# Patient Record
Sex: Male | Born: 1945
Health system: Southern US, Community
[De-identification: ages and names within clinical notes are randomized; demographics above are authoritative.]

## PROBLEM LIST (undated history)

## (undated) DIAGNOSIS — B360 Pityriasis versicolor: Secondary | ICD-10-CM

## (undated) DIAGNOSIS — G5600 Carpal tunnel syndrome, unspecified upper limb: Secondary | ICD-10-CM

## (undated) DIAGNOSIS — C4491 Basal cell carcinoma of skin, unspecified: Secondary | ICD-10-CM

## (undated) DIAGNOSIS — J069 Acute upper respiratory infection, unspecified: Secondary | ICD-10-CM

## (undated) DIAGNOSIS — M1990 Unspecified osteoarthritis, unspecified site: Secondary | ICD-10-CM

## (undated) DIAGNOSIS — N200 Calculus of kidney: Secondary | ICD-10-CM

## (undated) HISTORY — DX: Basal cell carcinoma of skin, unspecified: C44.91

## (undated) HISTORY — DX: Calculus of kidney: N20.0

## (undated) HISTORY — DX: Pityriasis versicolor: B36.0

## (undated) HISTORY — DX: Carpal tunnel syndrome, unspecified upper limb: G56.00

## (undated) HISTORY — PX: TONSILLECTOMY: SUR1361

## (undated) HISTORY — PX: VASECTOMY: SHX75

## (undated) HISTORY — PX: COLONOSCOPY: SHX174

## (undated) HISTORY — DX: Unspecified osteoarthritis, unspecified site: M19.90

---

## 1998-05-09 ENCOUNTER — Emergency Department (HOSPITAL_COMMUNITY): Admission: EM | Admit: 1998-05-09 | Discharge: 1998-05-09 | Payer: Self-pay | Admitting: Emergency Medicine

## 2011-08-17 ENCOUNTER — Other Ambulatory Visit (HOSPITAL_COMMUNITY): Payer: Self-pay | Admitting: Orthopedic Surgery

## 2011-08-17 ENCOUNTER — Other Ambulatory Visit: Payer: Self-pay | Admitting: Orthopedic Surgery

## 2011-08-17 DIAGNOSIS — M545 Low back pain, unspecified: Secondary | ICD-10-CM

## 2011-08-17 DIAGNOSIS — M5410 Radiculopathy, site unspecified: Secondary | ICD-10-CM

## 2011-08-18 ENCOUNTER — Ambulatory Visit
Admission: RE | Admit: 2011-08-18 | Discharge: 2011-08-18 | Disposition: A | Discharge status: Home / Self Care | Payer: BC Managed Care – PPO | Source: Ambulatory Visit | Attending: Orthopedic Surgery | Admitting: Orthopedic Surgery

## 2011-08-18 ENCOUNTER — Emergency Department (HOSPITAL_COMMUNITY)
Admission: EM | Admit: 2011-08-18 | Discharge: 2011-08-18 | Disposition: A | Discharge status: Home / Self Care | Payer: BC Managed Care – PPO | Attending: Emergency Medicine | Admitting: Emergency Medicine

## 2011-08-18 DIAGNOSIS — M545 Low back pain, unspecified: Secondary | ICD-10-CM | POA: Insufficient documentation

## 2011-08-18 DIAGNOSIS — M79609 Pain in unspecified limb: Secondary | ICD-10-CM | POA: Insufficient documentation

## 2011-08-18 DIAGNOSIS — R209 Unspecified disturbances of skin sensation: Secondary | ICD-10-CM | POA: Insufficient documentation

## 2011-08-18 DIAGNOSIS — M5430 Sciatica, unspecified side: Secondary | ICD-10-CM | POA: Insufficient documentation

## 2011-08-18 MED ORDER — GADOBENATE DIMEGLUMINE 529 MG/ML IV SOLN
14.0000 mL | Freq: Once | INTRAVENOUS | Status: AC | PRN
  Administered 2011-08-18: 14 mL via INTRAVENOUS

## 2011-09-28 ENCOUNTER — Ambulatory Visit (HOSPITAL_COMMUNITY)
Admission: RE | Admit: 2011-09-28 | Discharge: 2011-09-28 | Disposition: A | Discharge status: Home / Self Care | Payer: BC Managed Care – PPO | Source: Ambulatory Visit | Attending: Neurosurgery | Admitting: Neurosurgery

## 2011-09-28 ENCOUNTER — Encounter (HOSPITAL_COMMUNITY)
Admission: RE | Admit: 2011-09-28 | Discharge: 2011-09-28 | Disposition: A | Discharge status: Home / Self Care | Payer: BC Managed Care – PPO | Source: Ambulatory Visit | Attending: Neurosurgery | Admitting: Neurosurgery

## 2011-09-28 ENCOUNTER — Other Ambulatory Visit (HOSPITAL_COMMUNITY): Payer: Self-pay | Admitting: Neurosurgery

## 2011-09-28 ENCOUNTER — Encounter (HOSPITAL_COMMUNITY): Payer: Self-pay

## 2011-09-28 DIAGNOSIS — M79605 Pain in left leg: Secondary | ICD-10-CM

## 2011-09-28 DIAGNOSIS — Z01812 Encounter for preprocedural laboratory examination: Secondary | ICD-10-CM | POA: Insufficient documentation

## 2011-09-28 DIAGNOSIS — Z01818 Encounter for other preprocedural examination: Secondary | ICD-10-CM | POA: Insufficient documentation

## 2011-09-28 HISTORY — DX: Acute upper respiratory infection, unspecified: J06.9

## 2011-09-28 LAB — COMPREHENSIVE METABOLIC PANEL
ALT: 12 U/L (ref 0–53)
AST: 16 U/L (ref 0–37)
Albumin: 3.5 g/dL (ref 3.5–5.2)
Alkaline Phosphatase: 93 U/L (ref 39–117)
BUN: 12 mg/dL (ref 6–23)
CO2: 29 mEq/L (ref 19–32)
Calcium: 11 mg/dL — ABNORMAL HIGH (ref 8.4–10.5)
Chloride: 102 mEq/L (ref 96–112)
Creatinine, Ser: 0.74 mg/dL (ref 0.50–1.35)
GFR calc Af Amer: >90 mL/min (ref >90)
GFR calc non Af Amer: >90 mL/min (ref >90)
Glucose, Bld: 103 mg/dL — ABNORMAL HIGH (ref 70–99)
Potassium: 4.8 mEq/L (ref 3.5–5.1)
Sodium: 140 mEq/L (ref 135–145)
Total Bilirubin: 1 mg/dL (ref 0.3–1.2)
Total Protein: 6.5 g/dL (ref 6.0–8.3)

## 2011-09-28 LAB — SURGICAL PCR SCREEN
MRSA, PCR: NEGATIVE
Staphylococcus aureus: NEGATIVE

## 2011-09-28 LAB — CBC
HCT: 42 % (ref 39.0–52.0)
Hemoglobin: 13.2 g/dL (ref 13.0–17.0)
MCH: 29.1 pg (ref 26.0–34.0)
MCHC: 31.4 g/dL (ref 30.0–36.0)
MCV: 92.5 fL (ref 78.0–100.0)
Platelets: 299 10*3/uL (ref 150–400)
RBC: 4.54 MIL/uL (ref 4.22–5.81)
RDW: 12.7 % (ref 11.5–15.5)
WBC: 11.6 10*3/uL — ABNORMAL HIGH (ref 4.0–10.5)

## 2011-09-28 NOTE — Pre-Procedure Instructions (Signed)
20 Alexander Cummings  09/28/2011   Your procedure is scheduled on:   Monday  November 5TH  Report to [facility] at 0930 AM.  Call this number if you have problems the morning of surgery: [phone]   Remember:   Do not eat food:After Midnight.  Do not drink clear liquids: 4 Hours before arrival.  Take these medicines the morning of surgery with A SIP OF WATER: Tylenol as instructed by MD   Do not wear jewelry, make-up or nail polish.  Do not wear lotions, powders, or perfumes. You may wear deodorant.  Do not shave 48 hours prior to surgery.  Do not bring valuables to the hospital.  Contacts, dentures or bridgework may not be worn into surgery.  Leave suitcase in the car. After surgery it may be brought to your room.  For patients admitted to the hospital, checkout time is 11:00 AM the day of discharge.   Patients discharged the day of surgery will not be allowed to drive home.  Name and phone number of your driverISAAC DUBIE  -- [phone]  Special Instructions: CHG Shower Use Special Wash: 1/2 bottle night before surgery and 1/2 bottle morning of surgery.   Please read over the following fact sheets that you were given: Pain Booklet, Coughing and Deep Breathing, MRSA Information and Surgical Site Infection Prevention

## 2011-10-02 MED ORDER — CEFAZOLIN SODIUM 1-5 GM-% IV SOLN
1.0000 g | INTRAVENOUS | Status: DC
  Filled 2011-10-02: qty 50

## 2011-10-03 ENCOUNTER — Ambulatory Visit (HOSPITAL_COMMUNITY)
Admission: RE | Admit: 2011-10-03 | Discharge: 2011-10-04 | Disposition: A | Discharge status: Home / Self Care | Payer: BC Managed Care – PPO | Source: Ambulatory Visit | Attending: Neurosurgery | Admitting: Neurosurgery

## 2011-10-03 ENCOUNTER — Ambulatory Visit (HOSPITAL_COMMUNITY): Payer: BC Managed Care – PPO

## 2011-10-03 ENCOUNTER — Ambulatory Visit (HOSPITAL_COMMUNITY): Payer: BC Managed Care – PPO | Admitting: Anesthesiology

## 2011-10-03 ENCOUNTER — Encounter (HOSPITAL_COMMUNITY): Payer: Self-pay | Admitting: *Deleted

## 2011-10-03 ENCOUNTER — Encounter (HOSPITAL_COMMUNITY)
Admission: RE | Disposition: A | Discharge status: Home / Self Care | Payer: Self-pay | Source: Ambulatory Visit | Attending: Neurosurgery

## 2011-10-03 ENCOUNTER — Encounter (HOSPITAL_COMMUNITY): Payer: Self-pay | Admitting: Anesthesiology

## 2011-10-03 DIAGNOSIS — M5126 Other intervertebral disc displacement, lumbar region: Secondary | ICD-10-CM | POA: Insufficient documentation

## 2011-10-03 HISTORY — PX: LUMBAR LAMINECTOMY/DECOMPRESSION MICRODISCECTOMY: SHX5026

## 2011-10-03 SURGERY — LUMBAR LAMINECTOMY/DECOMPRESSION MICRODISCECTOMY
Anesthesia: General | Site: Spine Lumbar | Laterality: Left | Wound class: Clean

## 2011-10-03 MED ORDER — FENTANYL CITRATE 0.05 MG/ML IJ SOLN
INTRAMUSCULAR | Status: DC | PRN
  Administered 2011-10-03: 100 ug via INTRAVENOUS
  Administered 2011-10-03: 50 ug via INTRAVENOUS

## 2011-10-03 MED ORDER — EPHEDRINE SULFATE 50 MG/ML IJ SOLN
INTRAMUSCULAR | Status: DC | PRN
  Administered 2011-10-03 (×2): 10 mg via INTRAVENOUS

## 2011-10-03 MED ORDER — KETOROLAC TROMETHAMINE 30 MG/ML IJ SOLN
30.0000 mg | Freq: Four times a day (QID) | INTRAMUSCULAR | Status: DC
  Administered 2011-10-04 (×2): 30 mg via INTRAVENOUS
  Filled 2011-10-03 (×6): qty 1

## 2011-10-03 MED ORDER — SODIUM CHLORIDE 0.9 % IJ SOLN: 3.0000 mL | INTRAMUSCULAR | Status: DC | PRN

## 2011-10-03 MED ORDER — MORPHINE SULFATE 4 MG/ML IJ SOLN: 4.0000 mg | INTRAMUSCULAR | Status: DC | PRN

## 2011-10-03 MED ORDER — MENTHOL 3 MG MT LOZG
1.0000 | LOZENGE | OROMUCOSAL | Status: DC | PRN
  Administered 2011-10-03: 3 mg via ORAL
  Filled 2011-10-03: qty 9

## 2011-10-03 MED ORDER — ROCURONIUM BROMIDE 100 MG/10ML IV SOLN
INTRAVENOUS | Status: DC | PRN
  Administered 2011-10-03: 50 mg via INTRAVENOUS

## 2011-10-03 MED ORDER — ONDANSETRON HCL 4 MG/2ML IJ SOLN
INTRAMUSCULAR | Status: DC | PRN
  Administered 2011-10-03: 4 mg via INTRAVENOUS

## 2011-10-03 MED ORDER — ZOLPIDEM TARTRATE 5 MG PO TABS: 10.0000 mg | ORAL_TABLET | Freq: Every evening | ORAL | Status: DC | PRN

## 2011-10-03 MED ORDER — LIDOCAINE-EPINEPHRINE 1 %-1:100000 IJ SOLN
INTRAMUSCULAR | Status: DC | PRN
  Administered 2011-10-03: 6.5 mL

## 2011-10-03 MED ORDER — HYDROMORPHONE HCL PF 1 MG/ML IJ SOLN: 0.2500 mg | INTRAMUSCULAR | Status: DC | PRN

## 2011-10-03 MED ORDER — MEPERIDINE HCL 25 MG/ML IJ SOLN: 6.2500 mg | INTRAMUSCULAR | Status: DC | PRN

## 2011-10-03 MED ORDER — PROPOFOL 10 MG/ML IV EMUL
INTRAVENOUS | Status: DC | PRN
  Administered 2011-10-03: 200 mg via INTRAVENOUS

## 2011-10-03 MED ORDER — ALUM & MAG HYDROXIDE-SIMETH 400-400-40 MG/5ML PO SUSP
30.0000 mL | Freq: Four times a day (QID) | ORAL | Status: DC | PRN
  Filled 2011-10-03: qty 30

## 2011-10-03 MED ORDER — DOCUSATE SODIUM 100 MG PO CAPS
100.0000 mg | ORAL_CAPSULE | Freq: Two times a day (BID) | ORAL | Status: DC
  Administered 2011-10-03: 100 mg via ORAL
  Filled 2011-10-03: qty 1

## 2011-10-03 MED ORDER — GLYCOPYRROLATE 0.2 MG/ML IJ SOLN
INTRAMUSCULAR | Status: DC | PRN
  Administered 2011-10-03: 0.2 mg via INTRAVENOUS
  Administered 2011-10-03: .5 mg via INTRAVENOUS

## 2011-10-03 MED ORDER — ACETAMINOPHEN 650 MG RE SUPP: 650.0000 mg | RECTAL | Status: DC | PRN

## 2011-10-03 MED ORDER — SODIUM CHLORIDE 0.9 % IJ SOLN
3.0000 mL | Freq: Two times a day (BID) | INTRAMUSCULAR | Status: DC
  Administered 2011-10-03: 3 mL via INTRAVENOUS

## 2011-10-03 MED ORDER — FENTANYL CITRATE 0.05 MG/ML IJ SOLN
INTRAMUSCULAR | Status: DC | PRN
  Administered 2011-10-03 (×2): 100 ug via INTRAVENOUS

## 2011-10-03 MED ORDER — KETOROLAC TROMETHAMINE 30 MG/ML IJ SOLN
30.0000 mg | Freq: Once | INTRAMUSCULAR | Status: AC
  Administered 2011-10-03: 30 mg via INTRAVENOUS

## 2011-10-03 MED ORDER — METHYLPREDNISOLONE ACETATE PF 80 MG/ML IJ SUSP
INTRAMUSCULAR | Status: DC | PRN
  Administered 2011-10-03: 80 mg via INTRAMUSCULAR

## 2011-10-03 MED ORDER — HYDROXYZINE HCL 50 MG/ML IM SOLN: 50.0000 mg | INTRAMUSCULAR | Status: DC | PRN

## 2011-10-03 MED ORDER — SODIUM CHLORIDE 0.9 % IV SOLN: 250.0000 mL | INTRAVENOUS | Status: DC

## 2011-10-03 MED ORDER — HYDROXYZINE HCL 25 MG PO TABS: 50.0000 mg | ORAL_TABLET | ORAL | Status: DC | PRN

## 2011-10-03 MED ORDER — HYDROCODONE-ACETAMINOPHEN 5-325 MG PO TABS: 1.0000 | ORAL_TABLET | ORAL | Status: DC | PRN

## 2011-10-03 MED ORDER — NEOSTIGMINE METHYLSULFATE 1 MG/ML IJ SOLN
INTRAMUSCULAR | Status: DC | PRN
  Administered 2011-10-03: 3.5 mg via INTRAVENOUS

## 2011-10-03 MED ORDER — OXYCODONE-ACETAMINOPHEN 5-325 MG PO TABS
1.0000 | ORAL_TABLET | ORAL | Status: DC | PRN
  Filled 2011-10-03: qty 2

## 2011-10-03 MED ORDER — LACTATED RINGERS IV SOLN
INTRAVENOUS | Status: DC | PRN
  Administered 2011-10-03: 13:00:00 via INTRAVENOUS

## 2011-10-03 MED ORDER — SODIUM CHLORIDE 0.9 % IR SOLN
Status: DC | PRN
  Administered 2011-10-03: 14:00:00

## 2011-10-03 MED ORDER — DEXTROSE-NACL 5-0.45 % IV SOLN
INTRAVENOUS | Status: DC
  Administered 2011-10-03: 16:00:00 via INTRAVENOUS

## 2011-10-03 MED ORDER — MIDAZOLAM HCL 5 MG/5ML IJ SOLN
INTRAMUSCULAR | Status: DC | PRN
  Administered 2011-10-03: 2 mg via INTRAVENOUS

## 2011-10-03 MED ORDER — THROMBIN 5000 UNITS EX KIT
PACK | CUTANEOUS | Status: DC | PRN
  Administered 2011-10-03: 5000 [IU] via TOPICAL

## 2011-10-03 MED ORDER — BUPIVACAINE HCL (PF) 0.5 % IJ SOLN
INTRAMUSCULAR | Status: DC | PRN
  Administered 2011-10-03: 6.5 mL

## 2011-10-03 MED ORDER — ACETAMINOPHEN 325 MG PO TABS: 650.0000 mg | ORAL_TABLET | ORAL | Status: DC | PRN

## 2011-10-03 MED ORDER — PHENOL 1.4 % MT LIQD: 1.0000 | OROMUCOSAL | Status: DC | PRN

## 2011-10-03 MED ORDER — CEFAZOLIN SODIUM 1-5 GM-% IV SOLN
INTRAVENOUS | Status: DC | PRN
  Administered 2011-10-03: 1 g via INTRAVENOUS

## 2011-10-03 MED ORDER — ONDANSETRON HCL 4 MG/2ML IJ SOLN: 4.0000 mg | Freq: Once | INTRAMUSCULAR | Status: DC | PRN

## 2011-10-03 MED ORDER — CYCLOBENZAPRINE HCL 10 MG PO TABS
10.0000 mg | ORAL_TABLET | Freq: Three times a day (TID) | ORAL | Status: DC | PRN
  Filled 2011-10-03: qty 1

## 2011-10-03 SURGICAL SUPPLY — 63 items
ADH SKN CLS APL DERMABOND .7 (GAUZE/BANDAGES/DRESSINGS) ×1
APL SKNCLS STERI-STRIP NONHPOA (GAUZE/BANDAGES/DRESSINGS)
BAG DECANTER FOR FLEXI CONT (MISCELLANEOUS) ×2 IMPLANT
BENZOIN TINCTURE PRP APPL 2/3 (GAUZE/BANDAGES/DRESSINGS) IMPLANT
BLADE SURG ROTATE 9660 (MISCELLANEOUS) IMPLANT
BRUSH SCRUB EZ PLAIN DRY (MISCELLANEOUS) ×2 IMPLANT
BUR ACORN 6.0 ACORN (BURR) IMPLANT
BUR ACRON 5.0MM COATED (BURR) IMPLANT
BUR MATCHSTICK NEURO 3.0 LAGG (BURR) ×2 IMPLANT
CANISTER SUCTION 2500CC (MISCELLANEOUS) ×2 IMPLANT
CLOTH BEACON ORANGE TIMEOUT ST (SAFETY) ×2 IMPLANT
CONT SPEC 4OZ CLIKSEAL STRL BL (MISCELLANEOUS) IMPLANT
DERMABOND ADVANCED (GAUZE/BANDAGES/DRESSINGS) ×1
DERMABOND ADVANCED .7 DNX12 (GAUZE/BANDAGES/DRESSINGS) IMPLANT
DRAPE LAPAROTOMY 100X72X124 (DRAPES) ×2 IMPLANT
DRAPE MICROSCOPE LEICA (MISCELLANEOUS) ×2 IMPLANT
DRAPE POUCH INSTRU U-SHP 10X18 (DRAPES) ×2 IMPLANT
DRSG EMULSION OIL 3X3 NADH (GAUZE/BANDAGES/DRESSINGS) IMPLANT
ELECT REM PT RETURN 9FT ADLT (ELECTROSURGICAL) ×2
ELECTRODE REM PT RTRN 9FT ADLT (ELECTROSURGICAL) ×1 IMPLANT
GAUZE SPONGE 4X4 16PLY XRAY LF (GAUZE/BANDAGES/DRESSINGS) IMPLANT
GLOVE BIO SURGEON STRL SZ 6.5 (GLOVE) ×1 IMPLANT
GLOVE BIO SURGEON STRL SZ8.5 (GLOVE) ×1 IMPLANT
GLOVE BIOGEL PI IND STRL 8 (GLOVE) ×1 IMPLANT
GLOVE BIOGEL PI INDICATOR 8 (GLOVE) ×2
GLOVE ECLIPSE 7.5 STRL STRAW (GLOVE) ×2 IMPLANT
GLOVE EXAM NITRILE LRG STRL (GLOVE) IMPLANT
GLOVE EXAM NITRILE MD LF STRL (GLOVE) ×1 IMPLANT
GLOVE EXAM NITRILE XL STR (GLOVE) IMPLANT
GLOVE EXAM NITRILE XS STR PU (GLOVE) IMPLANT
GLOVE INDICATOR 6.5 STRL GRN (GLOVE) ×1 IMPLANT
GLOVE OPTIFIT SS 8.0 STRL (GLOVE) ×1 IMPLANT
GOWN BRE IMP SLV AUR LG STRL (GOWN DISPOSABLE) ×2 IMPLANT
GOWN BRE IMP SLV AUR XL STRL (GOWN DISPOSABLE) ×2 IMPLANT
GOWN STRL REIN 2XL LVL4 (GOWN DISPOSABLE) ×1 IMPLANT
KIT BASIN OR (CUSTOM PROCEDURE TRAY) ×2 IMPLANT
KIT ROOM TURNOVER OR (KITS) ×2 IMPLANT
NDL HYPO 18GX1.5 BLUNT FILL (NEEDLE) IMPLANT
NDL SPNL 18GX3.5 QUINCKE PK (NEEDLE) ×1 IMPLANT
NDL SPNL 22GX3.5 QUINCKE BK (NEEDLE) ×1 IMPLANT
NEEDLE HYPO 18GX1.5 BLUNT FILL (NEEDLE) ×2 IMPLANT
NEEDLE HYPO 22GX1.5 SAFETY (NEEDLE) ×2 IMPLANT
NEEDLE SPNL 18GX3.5 QUINCKE PK (NEEDLE) ×2 IMPLANT
NEEDLE SPNL 22GX3.5 QUINCKE BK (NEEDLE) ×2 IMPLANT
NS IRRIG 1000ML POUR BTL (IV SOLUTION) ×2 IMPLANT
PACK LAMINECTOMY NEURO (CUSTOM PROCEDURE TRAY) ×2 IMPLANT
PAD ARMBOARD 7.5X6 YLW CONV (MISCELLANEOUS) ×6 IMPLANT
PATTIES SURGICAL .5 X1 (DISPOSABLE) ×2 IMPLANT
RUBBERBAND STERILE (MISCELLANEOUS) ×4 IMPLANT
SPONGE GAUZE 4X4 12PLY (GAUZE/BANDAGES/DRESSINGS) IMPLANT
SPONGE LAP 4X18 X RAY DECT (DISPOSABLE) IMPLANT
SPONGE SURGIFOAM ABS GEL SZ50 (HEMOSTASIS) ×2 IMPLANT
STRIP CLOSURE SKIN 1/2X4 (GAUZE/BANDAGES/DRESSINGS) IMPLANT
SUT PROLENE 6 0 BV (SUTURE) IMPLANT
SUT VIC AB 1 CT1 18XBRD ANBCTR (SUTURE) ×1 IMPLANT
SUT VIC AB 1 CT1 8-18 (SUTURE) ×2
SUT VIC AB 2-0 CP2 18 (SUTURE) ×2 IMPLANT
SUT VIC AB 3-0 SH 8-18 (SUTURE) ×1 IMPLANT
SYR 20CC LL (SYRINGE) ×2 IMPLANT
SYR 5ML LL (SYRINGE) ×1 IMPLANT
TOWEL OR 17X24 6PK STRL BLUE (TOWEL DISPOSABLE) ×2 IMPLANT
TOWEL OR 17X26 10 PK STRL BLUE (TOWEL DISPOSABLE) ×2 IMPLANT
WATER STERILE IRR 1000ML POUR (IV SOLUTION) ×2 IMPLANT

## 2011-10-03 NOTE — H&P (Signed)
Subjective: Patient is a 65 y.o. male who is admitted for treatment of left L4-5 foraminal and extraforaminal disc herniation. Patient's symptoms began the first week of September. He was treated with a six-day prednisone Dosepak but continued to have significant pain and was evaluated further with an MRI scan MRI demonstrated the left L4-5 foraminal and extra foraminal disc herniation. And the patient is now admitted for surgical discectomy.    There are no active problems to display for this patient.  Past Medical History  Diagnosis Date  . Recurrent upper respiratory infection (URI)     gets colds infrequently...    Past Surgical History  Procedure Date  . Tonsillectomy     Prescriptions prior to admission  Medication Sig Dispense Refill  . acetaminophen (TYLENOL) 500 MG tablet Take 500 mg by mouth every 6 (six) hours as needed. For pain       . diclofenac (VOLTAREN) 75 MG EC tablet Take 75 mg by mouth 2 (two) times daily as needed. For pain       . HYDROcodone-acetaminophen (NORCO) 5-325 MG per tablet Take 1 tablet by mouth every 6 (six) hours as needed. For pain       . ibuprofen (ADVIL,MOTRIN) 200 MG tablet Take 200-400 mg by mouth every 6 (six) hours as needed. For pain       . simvastatin (ZOCOR) 20 MG tablet Take 20 mg by mouth daily.         No Known Allergies  History  Substance Use Topics  . Smoking status: Not on file  . Smokeless tobacco: Not on file  . Alcohol Use: 0.6 oz/week    1 Cans of beer per week    History reviewed. No pertinent family history.   Review of Systems A comprehensive review of systems was negative.  Objective: Vital signs in last 24 hours: Temp:  [98.3 F (36.8 C)] 98.3 F (36.8 C) (11/05 0941) Pulse Rate:  [68] 68  (11/05 0941) Resp:  [18] 18  (11/05 0941) BP: (126)/(75) 126/75 mmHg (11/05 0941) SpO2:  [99 %] 99 % (11/05 0941) Weight:  [68.947 kg (152 lb)] 152 lb (68.947 kg) (11/05 1034)  EXAM  Patient is a well-developed  well-nourished white male in no acute distress lungs are clear to auscultation he has symmetrical respiratory excursion heart has a regular rate and rhythm normal S1 and S2 there is a 2/6 systolic murmur extremity examination shows no clubbing cyanosis or edema. Musculoskeletal examination shows no tenderness to palpation over the lumbar spinous processes or paralumbar musculature he is limited in forward flexion and about 70-80. He is able to extend to 10. Straight leg raising is negative. Neurologic examination shows 5 over 5 strength in the lower extremities Including the iliopsoas quadriceps dorsiflexors EHL and plantar flexor bilaterally. Sensation is intact to pinprick in the distal lower from its. Reflexes are 2 in the quadriceps and gastrocnemius I they're symmetrical bilaterally and toes are downgoing bilaterally. He has a normal gait and stance. Data Review CBC:  Lab Results  Component Value Date   WBC 11.6* 09/28/2011   RBC 4.54 09/28/2011   BMP:  Lab Results  Component Value Date   GLUCOSE 103* 09/28/2011   CO2 29 09/28/2011   BUN 12 09/28/2011   CREATININE 0.74 09/28/2011   CALCIUM 11.0* 09/28/2011    Assessment/Plan: 65 year old man with lumbar disc herniation. Admitted for left L4-5 extraforaminal microdiscectomy. The nature of his condition and the nature of surgery and the risks of surgery  have been discussed with the patient including risks of bleeding infection nerve root dysfunction with pain weakness numbness or paresthesias the risk of recurrent disc herniation and anesthetic risks of myocardial infarction stroke pneumonia and death. Understanding this he wishes to proceed with surgery.   Hewitt Shorts, MD 10/03/2011 12:30 PM

## 2011-10-03 NOTE — Anesthesia Preprocedure Evaluation (Addendum)
Anesthesia Evaluation  Patient identified by MRN, date of birth, ID band Patient awake    Reviewed: Allergy & Precautions, H&P , NPO status , Patient's Chart, lab work & pertinent test results, reviewed documented beta blocker date and time   Airway Mallampati: I TM Distance: >3 FB Neck ROM: Full    Dental No notable dental hx.    Pulmonary    Pulmonary exam normal       Cardiovascular neg cardio ROS     Neuro/Psych    GI/Hepatic negative GI ROS, Neg liver ROS,   Endo/Other  Negative Endocrine ROS  Renal/GU negative Renal ROS     Musculoskeletal   Abdominal   Peds  Hematology   Anesthesia Other Findings   Reproductive/Obstetrics                          Anesthesia Physical Anesthesia Plan  ASA: II  Anesthesia Plan: General   Post-op Pain Management:    Induction: Intravenous  Airway Management Planned: Oral ETT  Additional Equipment:   Intra-op Plan:   Post-operative Plan: Extubation in OR  Informed Consent: I have reviewed the patients History and Physical, chart, labs and discussed the procedure including the risks, benefits and alternatives for the proposed anesthesia with the patient or authorized representative who has indicated his/her understanding and acceptance.   Dental advisory given  Plan Discussed with: CRNA and Surgeon  Anesthesia Plan Comments:        Anesthesia Quick Evaluation

## 2011-10-03 NOTE — Anesthesia Procedure Notes (Signed)
Procedures

## 2011-10-03 NOTE — Transfer of Care (Signed)
Immediate Anesthesia Transfer of Care Note  Patient: Alexander Cummings  Procedure(s) Performed:  LUMBAR LAMINECTOMY/DECOMPRESSION MICRODISCECTOMY - LEFT Lumbar four-five EXTRAFORAMINAL MICRODISKECTOMY  Patient Location: PACU  Anesthesia Type: General  Level of Consciousness: awake, alert  and oriented  Airway & Oxygen Therapy: Patient Spontanous Breathing and Patient connected to face mask oxygen  Post-op Assessment: Report given to PACU RN, Post -op Vital signs reviewed and stable and Patient moving all extremities X 4  Post vital signs: stable  Complications: No apparent anesthesia complications

## 2011-10-03 NOTE — Anesthesia Postprocedure Evaluation (Signed)
  Anesthesia Post-op Note  Patient: Alexander Cummings  Procedure(s) Performed:  LUMBAR LAMINECTOMY/DECOMPRESSION MICRODISCECTOMY - LEFT Lumbar four-five EXTRAFORAMINAL MICRODISKECTOMY  Patient Location: PACU  Anesthesia Type: General  Level of Consciousness: awake, alert  and oriented  Airway and Oxygen Therapy: Patient Spontanous Breathing  Post-op Pain: mild  Post-op Assessment: Post-op Vital signs reviewed, Patient's Cardiovascular Status Stable, Respiratory Function Stable, Patent Airway and Pain level controlled  Post-op Vital Signs: Reviewed and stable  Complications: No apparent anesthesia complications and possible nerve injury

## 2011-10-03 NOTE — Procedures (Signed)
10/03/2011  2:52 PM  PATIENT:  Alexander Cummings  65 y.o. male  PRE-OPERATIVE DIAGNOSIS:  LUMBAR HERNIATED DISC LUMBAR DEGENERATIVE DISC DISEASE LUMBAR FOUR-FIVE ,SPONDYLOSIS  POST-OPERATIVE DIAGNOSIS:  left lumbar four-fiveherniated disc,degenerative disc disease,spondylosis  PROCEDURE:  Procedure(s): Left L4-L5 extraforaminal MICRODISCECTOMY  SURGEON:  Surgeon(s): Jennelle Human  ASSISTANTS: Delma Officer  ANESTHESIA:   general  EBL:     BLOOD ADMINISTERED:25 CC PRBC  CELL SAVER GIVEN: None  COUNT:Correct per nursing staff  DRAINS: none   SPECIMEN:  No Specimen  DICTATION: Patient was brought to the operating room placed under general endotracheal anesthesia. Patient was turned to a prone position the lumbar region was prepped with Betadine soap and solution and draped in a sterile fashion. An x-ray was taken and the L4-5 level to find the midline was infiltrated locally with epinephrine. The incision made over the L4-5 level and carried down to subcutaneous tissue bipolar cautery and electrocautery used to maintain hemostasis. The paraspinal muscles were dissected from the spinous process and lamina in a subperiosteal fashion a self-retaining retractor was placed. An x-ray was taken and the L4-5 level identified. Dissection was carried out over the facet complex laterally into the intertransverse space the intertransverse muscle was dissected and the intertransverse fascia was removed as well a lateral facetectomy was performed using the high-speed drill. The operating microscope was draped and brought in the field to enhance visualization the remainder of the decompression was performed using microdissection microsurgical technique. We were able to take any extra foraminal space and identified the lateral aspect of the annulus and the disc herniation which extended rostral from the disc space was directly compressing the exiting L4 nerve root. The fragment was  separate from the surrounding tissues and removed decompressing the exiting L4 nerve root. We then further examined the extraforaminal space to ensure that there were no further fragments none were found it was felt that good decompression of the L4 nerve root had been achieved.  The wound was irrigated with bacitracin solution hemostasis was established with bipolar cautery and then we instilled 2 cc of fentanyl and 80 mg of Depo-Medrol into the extraforaminal space and then proceeded with closure. Deep fascia was closed with interrupted undyed 1 Vicryl sutures the subcutaneous and subcuticular closed with interrupted inverted 2-0 undyed Vicryl sutures. The skin edges were approximated with Dermabond.  Following surgery the patient is to be turned back in supine position reversed an anesthetic extubated and transferred to the recovery further care. PLAN OF CARE: Admit for overnight observation  PATIENT DISPOSITION:  PACU - hemodynamically stable.   Delay start of Pharmacological VTE agent (>24hrs) due to surgical blood loss or risk of bleeding:  yes

## 2011-10-03 NOTE — Progress Notes (Signed)
   Temp:  [97.6 F (36.4 C)-98.3 F (36.8 C)] 98.1 F (36.7 C) (11/05 1700) Pulse Rate:  [68-82] 82  (11/05 1700) Resp:  [16-35] 16  (11/05 1700) BP: (116-130)/(61-75) 130/70 mmHg (11/05 1700) SpO2:  [97 %-100 %] 98 % (11/05 1630) Weight:  [68.947 kg (152 lb)] 152 lb (68.947 kg) (11/05 1034)  He is seen in postoperative rounds in his unit bed on 3500. Patient notes significant improvement in his radicular symptoms with both pain relief and relief of numbness. He has already gotten up and out of bed and voided without difficulty his wound is clean and dry he ate his dinner he still has his IV running. We will have the nursing staff changes IV to a sterile lock and we encouraged to ambulate in the halls. Strength is good   Plan: Progressive ambulation activity.

## 2011-10-04 MED ORDER — HYDROCODONE-ACETAMINOPHEN 5-325 MG PO TABS: 1.0000 | ORAL_TABLET | ORAL | Status: AC | PRN

## 2011-10-04 MED FILL — Chlorhexidine Gluconate Liquid 4%: CUTANEOUS | Qty: 118 | Status: AC

## 2011-10-04 NOTE — Discharge Summary (Signed)
Physician Discharge Summary  Patient ID: Alexander Cummings MRN: [id number] DOB/AGE: Jun 19, 1946 65 y.o.  Admit date: 10/03/2011 Discharge date: 10/04/2011  Admission Diagnoses:Left L45 Lumbar HNP   Discharge Diagnoses: same Active Problems:  * No active hospital problems. *    Discharged Condition: good  Hospital Course: Patient was admitted for a microdiscectomy which was performed yesterday he is doing very well following surgery with excellent relief of his pain and numbness in the left lower extremity his wound is healing nicely and we have given him instructions regarding wound care and activities following discharge been given a prescription for Norco 5/325 taking 1 or 2 every 4 hours when necessary for pain he is to followup with me in the office in 3 weeks and let me know if he has any difficulties in the meantime.  Consults: none  Significant Diagnostic Studies: none  Treatments: surgery: Left L45 extraforaminal microdiscectomy  Discharge Exam: Blood pressure 95/53, pulse 69, temperature 98.8 F (37.1 C), temperature source Oral, resp. rate 16, height 5\' 8"  (1.727 m), weight 68.947 kg (152 lb), SpO2 97.00%.   Disposition: Home   Current Discharge Medication List    START taking these medications   Details  !! HYDROcodone-acetaminophen (NORCO) 5-325 MG per tablet Take 1-2 tablets by mouth every 4 (four) hours as needed for pain. Qty: 50 tablet, Refills: 0     !! - Potential duplicate medications found. Please discuss with provider.    CONTINUE these medications which have NOT CHANGED   Details  acetaminophen (TYLENOL) 500 MG tablet Take 500 mg by mouth every 6 (six) hours as needed. For pain     diclofenac (VOLTAREN) 75 MG EC tablet Take 75 mg by mouth 2 (two) times daily as needed. For pain     !! HYDROcodone-acetaminophen (NORCO) 5-325 MG per tablet Take 1 tablet by mouth every 6 (six) hours as needed. For pain     ibuprofen (ADVIL,MOTRIN) 200 MG tablet Take  200-400 mg by mouth every 6 (six) hours as needed. For pain     simvastatin (ZOCOR) 20 MG tablet Take 20 mg by mouth daily.       !! - Potential duplicate medications found. Please discuss with provider.       Signed: Hewitt Shorts, MD 10/04/2011, 7:30 AM

## 2011-10-11 ENCOUNTER — Encounter (HOSPITAL_COMMUNITY): Payer: Self-pay | Admitting: Neurosurgery

## 2014-01-26 HISTORY — PX: MOHS SURGERY: SUR867

## 2014-04-30 ENCOUNTER — Encounter: Payer: Self-pay | Admitting: Internal Medicine

## 2014-07-03 ENCOUNTER — Ambulatory Visit (AMBULATORY_SURGERY_CENTER): Payer: Self-pay

## 2014-07-03 VITALS — Ht 68.0 in | Wt 147.2 lb

## 2014-07-03 DIAGNOSIS — Z1211 Encounter for screening for malignant neoplasm of colon: Secondary | ICD-10-CM

## 2014-07-03 MED ORDER — SUPREP BOWEL PREP KIT 17.5-3.13-1.6 GM/177ML PO SOLN: 1.0000 | Freq: Once | ORAL | Status: DC

## 2014-07-03 NOTE — Progress Notes (Signed)
No allergies to eggs or soy No home oxygen No past problems anesthesia No diet/weight loss meds  Has email  Emmi instructions given for colonoscopy

## 2014-07-17 ENCOUNTER — Ambulatory Visit (AMBULATORY_SURGERY_CENTER): Payer: BC Managed Care – PPO | Admitting: Internal Medicine

## 2014-07-17 ENCOUNTER — Encounter: Payer: Self-pay | Admitting: Internal Medicine

## 2014-07-17 VITALS — BP 118/55 | HR 55 | Temp 96.9°F | Resp 18 | Ht 68.0 in | Wt 147.0 lb

## 2014-07-17 DIAGNOSIS — Z1211 Encounter for screening for malignant neoplasm of colon: Secondary | ICD-10-CM

## 2014-07-17 MED ORDER — SODIUM CHLORIDE 0.9 % IV SOLN: 500.0000 mL | INTRAVENOUS | Status: DC

## 2014-07-17 NOTE — Progress Notes (Signed)
Report to PACU, RN, vss, BBS= Clear.  

## 2014-07-17 NOTE — Op Note (Signed)
[place]  [address] [place], [id number]   COLONOSCOPY PROCEDURE REPORT  PATIENT: Alexander, Cummings  MR#: [id number] BIRTHDATE: 14-Aug-1946 , 27  yrs. old GENDER: Male ENDOSCOPIST: Gatha Mayer, MD, [facility] PROCEDURE DATE:  07/17/2014 PROCEDURE:   Colonoscopy, screening First Screening Colonoscopy - Avg.  risk and is 50 yrs.  old or older - No.  Prior Negative Screening - Now for repeat screening. 10 or more years since last screening  History of Adenoma - Now for follow-up colonoscopy & has been > or = to 3 yrs.  N/A  Polyps Removed Today? No.  Recommend repeat exam, <10 yrs? No. ASA CLASS:   Class I INDICATIONS:average risk screening and Last colonoscopy performed 10 years ago. MEDICATIONS: propofol (Diprivan) 200mg  IV, MAC sedation, administered by CRNA, and These medications were titrated to patient response per physician's verbal order  DESCRIPTION OF PROCEDURE:   After the risks benefits and alternatives of the procedure were thoroughly explained, informed consent was obtained.  A digital rectal exam revealed no abnormalities of the rectum, A digital rectal exam revealed no prostatic nodules, and A digital rectal exam revealed the prostate was not enlarged.   The LB [id number]  endoscope was introduced through the anus and advanced to the cecum, which was identified by both the appendix and ileocecal valve. No adverse events experienced.   The quality of the prep was Suprep good  The instrument was then slowly withdrawn as the colon was fully examined.  COLON FINDINGS: A normal appearing cecum, ileocecal valve, and appendiceal orifice were identified.  The ascending, hepatic flexure, transverse, splenic flexure, descending, sigmoid colon and rectum appeared unremarkable.  No polyps or cancers were seen.   A right colon retroflexion was performed.   There was a there was a narrow rectal vault so retroflexion was not performed. Retroflexion was  not performed due to a narrow rectal vault. The time to cecum=1 minutes 56 seconds.  Withdrawal time=8 minutes 06 seconds.  The scope was withdrawn and the procedure completed. COMPLICATIONS: There were no complications.  ENDOSCOPIC IMPRESSION: 1.   Normal colon 2.   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

## 2014-07-17 NOTE — Patient Instructions (Addendum)
The colon was nornal.  Next routine colonoscopy in 10 years - 2025  I appreciate the opportunity to care for you. Gatha Mayer, MD, FACG  YOU HAD AN ENDOSCOPIC PROCEDURE TODAY AT [facility]: Refer to the procedure report that was given to you for any specific questions about what was found during the examination.  If the procedure report does not answer your questions, please call your gastroenterologist to clarify.  If you requested that your care partner not be given the details of your procedure findings, then the procedure report has been included in a sealed envelope for you to review at your convenience later.  YOU SHOULD EXPECT: Some feelings of bloating in the abdomen. Passage of more gas than usual.  Walking can help get rid of the air that was put into your GI tract during the procedure and reduce the bloating. If you had a lower endoscopy (such as a colonoscopy or flexible sigmoidoscopy) you may notice spotting of blood in your stool or on the toilet paper. If you underwent a bowel prep for your procedure, then you may not have a normal bowel movement for a few days.  DIET: Your first meal following the procedure should be a light meal and then it is ok to progress to your normal diet.  A half-sandwich or bowl of soup is an example of a good first meal.  Heavy or fried foods are harder to digest and may make you feel nauseous or bloated.  Likewise meals heavy in dairy and vegetables can cause extra gas to form and this can also increase the bloating.  Drink plenty of fluids but you should avoid alcoholic beverages for 24 hours.  ACTIVITY: Your care partner should take you home directly after the procedure.  You should plan to take it easy, moving slowly for the rest of the day.  You can resume normal activity the day after the procedure however you should NOT DRIVE or use heavy machinery for 24 hours (because of the sedation medicines used during the test).    SYMPTOMS  TO REPORT IMMEDIATELY: A gastroenterologist can be reached at any hour.  During normal business hours, 8:30 AM to 5:00 PM Monday through Friday, call [phone].  After hours and on weekends, please call the GI answering service at [phone] who will take a message and have the physician on call contact you.   Following lower endoscopy (colonoscopy or flexible sigmoidoscopy):  Excessive amounts of blood in the stool  Significant tenderness or worsening of abdominal pains  Swelling of the abdomen that is new, acute  Fever of 100F or higher   FOLLOW UP: If any biopsies were taken you will be contacted by phone or by letter within the next 1-3 weeks.  Call your gastroenterologist if you have not heard about the biopsies in 3 weeks.  Our staff will call the home number listed on your records the next business day following your procedure to check on you and address any questions or concerns that you may have at that time regarding the information given to you following your procedure. This is a courtesy call and so if there is no answer at the home number and we have not heard from you through the emergency physician on call, we will assume that you have returned to your regular daily activities without incident.  SIGNATURES/CONFIDENTIALITY: You and/or your care partner have signed paperwork which will be entered into your electronic medical record.  These  signatures attest to the fact that that the information above on your After Visit Summary has been reviewed and is understood.  Full responsibility of the confidentiality of this discharge information lies with you and/or your care-partner. 

## 2014-07-18 ENCOUNTER — Telehealth: Payer: Self-pay | Admitting: *Deleted

## 2014-07-18 NOTE — Telephone Encounter (Signed)
  Follow up Call-  Call back number 07/17/2014  Post procedure Call Back phone  # [phone] may speak with wife  Permission to leave phone message Yes     Patient questions:  Do you have a fever, pain , or abdominal swelling? No. Pain Score  0 *  Have you tolerated food without any problems? Yes.    Have you been able to return to your normal activities? Yes.    Do you have any questions about your discharge instructions: Diet   No. Medications  No. Follow up visit  No.  Do you have questions or concerns about your Care? No.  Actions: * If pain score is 4 or above: No action needed, pain <4.

## 2015-11-24 ENCOUNTER — Ambulatory Visit
Admission: RE | Admit: 2015-11-24 | Discharge: 2015-11-24 | Disposition: A | Discharge status: Home / Self Care | Payer: BLUE CROSS/BLUE SHIELD | Source: Ambulatory Visit | Attending: Family Medicine | Admitting: Family Medicine

## 2015-11-24 ENCOUNTER — Other Ambulatory Visit: Payer: Self-pay | Admitting: Family Medicine

## 2015-11-24 DIAGNOSIS — S9032XA Contusion of left foot, initial encounter: Secondary | ICD-10-CM

## 2016-07-25 DIAGNOSIS — D2271 Melanocytic nevi of right lower limb, including hip: Secondary | ICD-10-CM | POA: Diagnosis not present

## 2016-07-25 DIAGNOSIS — C44619 Basal cell carcinoma of skin of left upper limb, including shoulder: Secondary | ICD-10-CM | POA: Diagnosis not present

## 2016-07-25 DIAGNOSIS — Z85828 Personal history of other malignant neoplasm of skin: Secondary | ICD-10-CM | POA: Diagnosis not present

## 2016-07-25 DIAGNOSIS — D1801 Hemangioma of skin and subcutaneous tissue: Secondary | ICD-10-CM | POA: Diagnosis not present

## 2016-07-25 DIAGNOSIS — D225 Melanocytic nevi of trunk: Secondary | ICD-10-CM | POA: Diagnosis not present

## 2016-07-25 DIAGNOSIS — L738 Other specified follicular disorders: Secondary | ICD-10-CM | POA: Diagnosis not present

## 2016-07-25 DIAGNOSIS — L859 Epidermal thickening, unspecified: Secondary | ICD-10-CM | POA: Diagnosis not present

## 2016-07-25 DIAGNOSIS — L821 Other seborrheic keratosis: Secondary | ICD-10-CM | POA: Diagnosis not present

## 2016-07-25 DIAGNOSIS — D485 Neoplasm of uncertain behavior of skin: Secondary | ICD-10-CM | POA: Diagnosis not present

## 2016-07-28 DIAGNOSIS — Z411 Encounter for cosmetic surgery: Secondary | ICD-10-CM | POA: Diagnosis not present

## 2016-07-28 DIAGNOSIS — C44619 Basal cell carcinoma of skin of left upper limb, including shoulder: Secondary | ICD-10-CM | POA: Diagnosis not present

## 2016-09-13 DIAGNOSIS — Z0000 Encounter for general adult medical examination without abnormal findings: Secondary | ICD-10-CM | POA: Diagnosis not present

## 2016-09-13 DIAGNOSIS — E7800 Pure hypercholesterolemia, unspecified: Secondary | ICD-10-CM | POA: Diagnosis not present

## 2016-09-13 DIAGNOSIS — Z1159 Encounter for screening for other viral diseases: Secondary | ICD-10-CM | POA: Diagnosis not present

## 2016-09-13 DIAGNOSIS — R430 Anosmia: Secondary | ICD-10-CM | POA: Diagnosis not present

## 2016-09-13 DIAGNOSIS — M19042 Primary osteoarthritis, left hand: Secondary | ICD-10-CM | POA: Diagnosis not present

## 2016-09-13 DIAGNOSIS — Z125 Encounter for screening for malignant neoplasm of prostate: Secondary | ICD-10-CM | POA: Diagnosis not present

## 2016-09-13 DIAGNOSIS — Z23 Encounter for immunization: Secondary | ICD-10-CM | POA: Diagnosis not present

## 2016-09-13 DIAGNOSIS — L989 Disorder of the skin and subcutaneous tissue, unspecified: Secondary | ICD-10-CM | POA: Diagnosis not present

## 2016-09-13 DIAGNOSIS — M19041 Primary osteoarthritis, right hand: Secondary | ICD-10-CM | POA: Diagnosis not present

## 2016-09-16 ENCOUNTER — Other Ambulatory Visit: Payer: Self-pay | Admitting: Family Medicine

## 2016-09-16 DIAGNOSIS — R430 Anosmia: Secondary | ICD-10-CM

## 2016-09-19 DIAGNOSIS — H1013 Acute atopic conjunctivitis, bilateral: Secondary | ICD-10-CM | POA: Diagnosis not present

## 2016-10-03 ENCOUNTER — Ambulatory Visit
Admission: RE | Admit: 2016-10-03 | Discharge: 2016-10-03 | Disposition: A | Discharge status: Home / Self Care | Payer: PPO | Source: Ambulatory Visit | Attending: Family Medicine | Admitting: Family Medicine

## 2016-10-03 DIAGNOSIS — R430 Anosmia: Secondary | ICD-10-CM

## 2017-07-26 DIAGNOSIS — Z85828 Personal history of other malignant neoplasm of skin: Secondary | ICD-10-CM | POA: Diagnosis not present

## 2017-07-26 DIAGNOSIS — D2271 Melanocytic nevi of right lower limb, including hip: Secondary | ICD-10-CM | POA: Diagnosis not present

## 2017-07-26 DIAGNOSIS — B079 Viral wart, unspecified: Secondary | ICD-10-CM | POA: Diagnosis not present

## 2017-07-26 DIAGNOSIS — D225 Melanocytic nevi of trunk: Secondary | ICD-10-CM | POA: Diagnosis not present

## 2017-07-26 DIAGNOSIS — L821 Other seborrheic keratosis: Secondary | ICD-10-CM | POA: Diagnosis not present

## 2017-07-26 DIAGNOSIS — L570 Actinic keratosis: Secondary | ICD-10-CM | POA: Diagnosis not present

## 2017-09-15 DIAGNOSIS — E7800 Pure hypercholesterolemia, unspecified: Secondary | ICD-10-CM | POA: Diagnosis not present

## 2017-09-15 DIAGNOSIS — M19042 Primary osteoarthritis, left hand: Secondary | ICD-10-CM | POA: Diagnosis not present

## 2017-09-15 DIAGNOSIS — Z23 Encounter for immunization: Secondary | ICD-10-CM | POA: Diagnosis not present

## 2017-09-15 DIAGNOSIS — M19041 Primary osteoarthritis, right hand: Secondary | ICD-10-CM | POA: Diagnosis not present

## 2017-09-15 DIAGNOSIS — Z125 Encounter for screening for malignant neoplasm of prostate: Secondary | ICD-10-CM | POA: Diagnosis not present

## 2017-09-15 DIAGNOSIS — Z0000 Encounter for general adult medical examination without abnormal findings: Secondary | ICD-10-CM | POA: Diagnosis not present

## 2017-09-15 DIAGNOSIS — R430 Anosmia: Secondary | ICD-10-CM | POA: Diagnosis not present

## 2017-12-18 DIAGNOSIS — H04123 Dry eye syndrome of bilateral lacrimal glands: Secondary | ICD-10-CM | POA: Diagnosis not present

## 2018-08-31 DIAGNOSIS — D225 Melanocytic nevi of trunk: Secondary | ICD-10-CM | POA: Diagnosis not present

## 2018-08-31 DIAGNOSIS — D0362 Melanoma in situ of left upper limb, including shoulder: Secondary | ICD-10-CM | POA: Diagnosis not present

## 2018-08-31 DIAGNOSIS — Z23 Encounter for immunization: Secondary | ICD-10-CM | POA: Diagnosis not present

## 2018-08-31 DIAGNOSIS — D485 Neoplasm of uncertain behavior of skin: Secondary | ICD-10-CM | POA: Diagnosis not present

## 2018-08-31 DIAGNOSIS — L570 Actinic keratosis: Secondary | ICD-10-CM | POA: Diagnosis not present

## 2018-08-31 DIAGNOSIS — L821 Other seborrheic keratosis: Secondary | ICD-10-CM | POA: Diagnosis not present

## 2018-08-31 DIAGNOSIS — D2271 Melanocytic nevi of right lower limb, including hip: Secondary | ICD-10-CM | POA: Diagnosis not present

## 2018-08-31 DIAGNOSIS — Z85828 Personal history of other malignant neoplasm of skin: Secondary | ICD-10-CM | POA: Diagnosis not present

## 2018-09-20 ENCOUNTER — Other Ambulatory Visit: Payer: Self-pay

## 2018-09-20 ENCOUNTER — Encounter (HOSPITAL_COMMUNITY): Payer: Self-pay | Admitting: Emergency Medicine

## 2018-09-20 ENCOUNTER — Emergency Department (HOSPITAL_COMMUNITY)
Admission: EM | Admit: 2018-09-20 | Discharge: 2018-09-20 | Disposition: A | Discharge status: Home / Self Care | Payer: PPO | Attending: Emergency Medicine | Admitting: Emergency Medicine

## 2018-09-20 ENCOUNTER — Emergency Department (HOSPITAL_COMMUNITY): Payer: PPO

## 2018-09-20 DIAGNOSIS — R001 Bradycardia, unspecified: Secondary | ICD-10-CM | POA: Diagnosis not present

## 2018-09-20 DIAGNOSIS — Z79899 Other long term (current) drug therapy: Secondary | ICD-10-CM | POA: Diagnosis not present

## 2018-09-20 DIAGNOSIS — H903 Sensorineural hearing loss, bilateral: Secondary | ICD-10-CM | POA: Diagnosis not present

## 2018-09-20 DIAGNOSIS — Z125 Encounter for screening for malignant neoplasm of prostate: Secondary | ICD-10-CM | POA: Diagnosis not present

## 2018-09-20 DIAGNOSIS — I499 Cardiac arrhythmia, unspecified: Secondary | ICD-10-CM | POA: Diagnosis not present

## 2018-09-20 DIAGNOSIS — R634 Abnormal weight loss: Secondary | ICD-10-CM | POA: Diagnosis not present

## 2018-09-20 DIAGNOSIS — R6881 Early satiety: Secondary | ICD-10-CM | POA: Diagnosis not present

## 2018-09-20 DIAGNOSIS — R5383 Other fatigue: Secondary | ICD-10-CM | POA: Diagnosis not present

## 2018-09-20 DIAGNOSIS — Z23 Encounter for immunization: Secondary | ICD-10-CM | POA: Diagnosis not present

## 2018-09-20 DIAGNOSIS — E7800 Pure hypercholesterolemia, unspecified: Secondary | ICD-10-CM | POA: Diagnosis not present

## 2018-09-20 DIAGNOSIS — Z0000 Encounter for general adult medical examination without abnormal findings: Secondary | ICD-10-CM | POA: Diagnosis not present

## 2018-09-20 DIAGNOSIS — I442 Atrioventricular block, complete: Secondary | ICD-10-CM | POA: Diagnosis not present

## 2018-09-20 DIAGNOSIS — M79641 Pain in right hand: Secondary | ICD-10-CM | POA: Diagnosis not present

## 2018-09-20 LAB — CBC
HCT: 45.3 % (ref 39.0–52.0)
Hemoglobin: 14.5 g/dL (ref 13.0–17.0)
MCH: 30.3 pg (ref 26.0–34.0)
MCHC: 32 g/dL (ref 30.0–36.0)
MCV: 94.6 fL (ref 80.0–100.0)
Platelets: 209 10*3/uL (ref 150–400)
RBC: 4.79 MIL/uL (ref 4.22–5.81)
RDW: 12.5 % (ref 11.5–15.5)
WBC: 5.5 10*3/uL (ref 4.0–10.5)
nRBC: 0 % (ref 0.0–0.2)

## 2018-09-20 LAB — BASIC METABOLIC PANEL
Anion gap: 5 (ref 5–15)
BUN: 16 mg/dL (ref 8–23)
CO2: 26 mmol/L (ref 22–32)
Calcium: 9.9 mg/dL (ref 8.9–10.3)
Chloride: 106 mmol/L (ref 98–111)
Creatinine, Ser: 0.78 mg/dL (ref 0.61–1.24)
GFR calc Af Amer: >60 mL/min (ref >60)
GFR calc non Af Amer: >60 mL/min (ref >60)
Glucose, Bld: 101 mg/dL — ABNORMAL HIGH (ref 70–99)
Potassium: 4.2 mmol/L (ref 3.5–5.1)
Sodium: 137 mmol/L (ref 135–145)

## 2018-09-20 LAB — I-STAT TROPONIN, ED: Troponin i, poc: 0.01 ng/mL (ref 0.00–0.08)

## 2018-09-20 NOTE — Discharge Instructions (Signed)
Your EKG and work-up today is reassuring.  Your heart rate is a bit slower than normal but we do not see any abnormal heart rhythms.  You can follow-up with cardiology if your heart rate continues to be lower than usual, and I would like you to follow-up with your primary care doctor to continue the work-up regarding your weight loss.  Return to the emergency department for any chest pain, shortness of breath if you feel lightheaded or like you are going to pass out, significantly more fatigued than usual or any other new or concerning symptoms.

## 2018-09-20 NOTE — ED Provider Notes (Signed)
[facility] EMERGENCY DEPARTMENT Provider Note   CSN: [id number] Arrival date & time: 09/20/18  1133     History   Chief Complaint Chief Complaint  Patient presents with  . sent from dr's office for abn ekg    HPI Alexander Cummings is a 72 y.o. male.  Alexander BASNETT is a 72 y.o. Male with a history of recurrent respiratory infections, who presents to the emergency department from his primary care doctor's office for evaluation of possible abnormal EKG.  He went in today for his typical annual physical and was noted to have a heart rate that was slightly so than usual, he typically runs in the 50s and 60s and his heart rate was noted to be in the 40s at his doctor's office, an EKG was done and the parenteral reading suggested possible A-V dissociation, and so the patient was sent in for further evaluation.  He has not had any chest pain or shortness of breath.  No lightheadedness or syncope.  No increasing fatigue.  Patient is still very active plays tennis and golf regularly.  Patient does note that he has perspired a bit more than usual while doing things outside but also notes is been hot and attributed this to be the reason.  He denies any headaches, vision changes, numbness or weakness.  No abdominal pain, nausea, vomiting or diarrhea.  Patient does report that over the past year he has lost about 10 pounds unintentionally and his wife feels that his appetite is less than usual but he has not had any associated abdominal pain or other obvious focal symptoms.  This is been discussed with his primary care doctor who is working this up with CT scans as well as lab work which they initiated today.  Patient does not have history of heart problems and does not see a cardiologist.  He does report his mother had to have a pacemaker due to sinus pauses, but no other significant family history of cardiac issues.     Past Medical History:  Diagnosis Date  . Recurrent upper  respiratory infection (URI)    gets colds infrequently...    There are no active problems to display for this patient.   Past Surgical History:  Procedure Laterality Date  . COLONOSCOPY    . LUMBAR LAMINECTOMY/DECOMPRESSION MICRODISCECTOMY  10/03/2011   Procedure: LUMBAR LAMINECTOMY/DECOMPRESSION MICRODISCECTOMY;  Surgeon: Hosie Spangle;  Location: [facility];  Service: Neurosurgery;  Laterality: Left;  LEFT Lumbar four-five EXTRAFORAMINAL MICRODISKECTOMY  . TONSILLECTOMY          Home Medications    Prior to Admission medications   Medication Sig Start Date End Date Taking? Authorizing Provider  acetaminophen (TYLENOL) 500 MG tablet Take 500 mg by mouth every 6 (six) hours as needed. For pain     [provider]  ibuprofen (ADVIL,MOTRIN) 200 MG tablet Take 200-400 mg by mouth every 6 (six) hours as needed. For pain     [provider]  simvastatin (ZOCOR) 20 MG tablet Take 20 mg by mouth daily.      [provider]    Family History Family History  Problem Relation Age of Onset  . Pancreatic cancer Brother 67       dx'd 5 months before death    Social History Social History   Tobacco Use  . Smoking status: Never Smoker  . Smokeless tobacco: Never Used  Substance Use Topics  . Alcohol use: Yes  Alcohol/week: 1.0 standard drinks    Types: 1 Cans of beer per week  . Drug use: No     Allergies   Patient has no known allergies.   Review of Systems Review of Systems  Constitutional: Negative for activity change, appetite change, chills, fatigue and fever.  HENT: Negative.   Eyes: Negative for visual disturbance.  Respiratory: Negative for cough, shortness of breath and wheezing.   Cardiovascular: Negative for chest pain, palpitations and leg swelling.  Gastrointestinal: Negative for abdominal pain, nausea and vomiting.  Genitourinary: Negative for dysuria, flank pain and frequency.  Musculoskeletal: Negative for arthralgias and  myalgias.  Skin: Negative for color change and rash.  Neurological: Negative for dizziness, facial asymmetry, weakness, light-headedness, numbness and headaches.     Physical Exam Updated Vital Signs BP 139/74   Pulse (!) 47   Temp 98.7 F (37.1 C) (Oral)   Resp 15   Ht 5\' 7"  (1.702 m)   Wt 59 kg   SpO2 100%   BMI 20.36 kg/m   Physical Exam  Constitutional: He appears well-developed and well-nourished. No distress.  HENT:  Head: Normocephalic and atraumatic.  Mouth/Throat: Oropharynx is clear and moist.  Eyes: Pupils are equal, round, and reactive to light. EOM are normal. Right eye exhibits no discharge. Left eye exhibits no discharge.  Neck: Neck supple.  Cardiovascular: Regular rhythm, normal heart sounds and intact distal pulses.  Mild bradycardia  Pulmonary/Chest: Effort normal and breath sounds normal. No respiratory distress. He has no wheezes. He has no rales.  Respirations equal and unlabored, patient able to speak in full sentences, lungs clear to auscultation bilaterally  Abdominal: Soft. Bowel sounds are normal. He exhibits no distension and no mass. There is no tenderness. There is no guarding.  Abdomen soft, nondistended, nontender to palpation in all quadrants without guarding or peritoneal signs  Musculoskeletal: He exhibits no edema or deformity.  Neurological: He is alert. Coordination normal.  Speech is clear, able to follow commands CN III-XII intact Normal strength in upper and lower extremities bilaterally including dorsiflexion and plantar flexion, strong and equal grip strength Sensation normal to light and sharp touch Moves extremities without ataxia, coordination intact Normal finger to nose and rapid alternating movements No pronator drift  Skin: Skin is warm and dry. Capillary refill takes less than 2 seconds. He is not diaphoretic.  Psychiatric: He has a normal mood and affect. His behavior is normal.  Nursing note and vitals reviewed.    ED  Treatments / Results  Labs (all labs ordered are listed, but only abnormal results are displayed) Labs Reviewed  BASIC METABOLIC PANEL - Abnormal; Notable for the following components:      Result Value   Glucose, Bld 101 (*)    All other components within normal limits  CBC  I-STAT TROPONIN, ED    EKG EKG Interpretation  Date/Time:  Thursday September 20 2018 11:36:42 EDT Ventricular Rate:  55 PR Interval:  184 QRS Duration: 94 QT Interval:  420 QTC Calculation: 401 R Axis:   79 Text Interpretation:  Sinus bradycardia Abnormal ECG No old tracing to compare Confirmed by Isla Pence [phone]) on 09/20/2018 3:13:48 PM   Radiology Dg Chest 2 View  Result Date: 09/20/2018 CLINICAL DATA:  Irregular heart rate. EXAM: CHEST - 2 VIEW COMPARISON:  Radiographs of September 28, 2011. FINDINGS: The heart size and mediastinal contours are within normal limits. Both lungs are clear. No pneumothorax or pleural effusion is noted. The visualized skeletal structures are  unremarkable. IMPRESSION: No active cardiopulmonary disease. Electronically Signed   By: Marijo Conception, M.D.   On: 09/20/2018 12:33    Procedures Procedures (including critical care time)  Medications Ordered in ED Medications - No data to display   Initial Impression / Assessment and Plan / ED Course  I have reviewed the triage vital signs and the nursing notes.  Pertinent labs & imaging results that were available during my care of the patient were reviewed by me and considered in my medical decision making (see chart for details).  Patient presents from PCPs office for concern for abnormal EKG, bradycardia with possible AV disassociation.  On my review there appears to be a P wave associated with every QRS and repeat EKG here with less baseline artifact shows clear sinus bradycardia with no evidence of heart block.  Patient is completely asymptomatic he has had no recent increasing fatigue, no syncopal symptoms, no chest  pain or shortness of breath and has been active as usual.  He did note some unintentional weight loss over the past year which his primary care doctor is working up.  Basic labs and troponin overall reassuring, troponin is 0, normal hemoglobin and no leukocytosis and no likely derangements requiring intervention.  Chest x-ray is unremarkable.  Case discussed with Dr. Gilford Raid who saw and evaulated pt as well.  Feel patient is stable for discharge home and continued follow-up with his primary care doctor, if patient continues to have bradycardia he may follow-up with cardiology.  At this time there does not appear to be any evidence of an acute emergency medical condition and the patient appears stable for discharge with appropriate outpatient follow up.Diagnosis was discussed with patient who verbalizes understanding and is agreeable to discharge.   Final Clinical Impressions(s) / ED Diagnoses   Final diagnoses:  Bradycardia    ED Discharge Orders    None       Jacqlyn Larsen, [place] 09/20/18 1544    Isla Pence, MD 09/20/18 1547

## 2018-09-20 NOTE — ED Notes (Signed)
Patient given discharge instructions and verbalized understanding.  Patient stable to discharge at this time.  Patient is alert and oriented to baseline.  No distressed noted at this time.  All belongings taken with the patient at discharge.   

## 2018-09-20 NOTE — ED Provider Notes (Signed)
MSE was initiated and I personally evaluated the patient and placed orders (if any) at  2:54 PM on September 20, 2018.  The patient appears stable so that the remainder of the MSE may be completed by another provider.  Patient triaged as a 3rd degree AV block. He was sent over from PCPs office for concern for heart block and bradycardia. EKG from the office as below does not appear to be in third-degree block as the P waves appear associated with every QRS.  He is stable.  I have reviewed his labs which appear within normal limits.  Our EKG shows sinus bradycardia.  Chest x-ray appears normal and he will be seen by the next set of oncoming providers.          Margarita Mail, PA-C 09/20/18 1512    Gareth Morgan, MD 09/21/18 2242

## 2018-09-20 NOTE — ED Triage Notes (Signed)
Sent from dr's office for possible 3rd degree heart block, - ekg done on arrival - reviewed by dr.  Denies any pain/discomfort- has lost 10 # in past few months.

## 2018-09-24 ENCOUNTER — Other Ambulatory Visit: Payer: Self-pay | Admitting: Family Medicine

## 2018-09-24 DIAGNOSIS — R17 Unspecified jaundice: Secondary | ICD-10-CM

## 2018-09-24 DIAGNOSIS — R634 Abnormal weight loss: Secondary | ICD-10-CM

## 2018-10-01 DIAGNOSIS — D0359 Melanoma in situ of other part of trunk: Secondary | ICD-10-CM | POA: Diagnosis not present

## 2018-10-02 ENCOUNTER — Other Ambulatory Visit: Payer: PPO

## 2018-10-04 DIAGNOSIS — H903 Sensorineural hearing loss, bilateral: Secondary | ICD-10-CM | POA: Diagnosis not present

## 2018-10-10 ENCOUNTER — Ambulatory Visit
Admission: RE | Admit: 2018-10-10 | Discharge: 2018-10-10 | Disposition: A | Discharge status: Home / Self Care | Payer: PPO | Source: Ambulatory Visit | Attending: Family Medicine | Admitting: Family Medicine

## 2018-10-10 DIAGNOSIS — K8020 Calculus of gallbladder without cholecystitis without obstruction: Secondary | ICD-10-CM | POA: Diagnosis not present

## 2018-10-10 DIAGNOSIS — R17 Unspecified jaundice: Secondary | ICD-10-CM

## 2018-10-10 DIAGNOSIS — K7689 Other specified diseases of liver: Secondary | ICD-10-CM | POA: Diagnosis not present

## 2018-10-10 DIAGNOSIS — R634 Abnormal weight loss: Secondary | ICD-10-CM

## 2018-10-10 MED ORDER — IOPAMIDOL (ISOVUE-300) INJECTION 61%
100.0000 mL | Freq: Once | INTRAVENOUS | Status: AC | PRN
  Administered 2018-10-10: 100 mL via INTRAVENOUS

## 2018-10-10 MED ORDER — IOPAMIDOL (ISOVUE-300) INJECTION 61%: 100.0000 mL | Freq: Once | INTRAVENOUS | Status: DC | PRN

## 2018-10-18 DIAGNOSIS — G5601 Carpal tunnel syndrome, right upper limb: Secondary | ICD-10-CM | POA: Diagnosis not present

## 2018-10-29 DIAGNOSIS — G5601 Carpal tunnel syndrome, right upper limb: Secondary | ICD-10-CM | POA: Diagnosis not present

## 2018-11-06 DIAGNOSIS — G5601 Carpal tunnel syndrome, right upper limb: Secondary | ICD-10-CM | POA: Diagnosis not present

## 2018-11-12 DIAGNOSIS — L905 Scar conditions and fibrosis of skin: Secondary | ICD-10-CM | POA: Diagnosis not present

## 2018-11-12 DIAGNOSIS — D0359 Melanoma in situ of other part of trunk: Secondary | ICD-10-CM | POA: Diagnosis not present

## 2018-12-04 DIAGNOSIS — H04123 Dry eye syndrome of bilateral lacrimal glands: Secondary | ICD-10-CM | POA: Diagnosis not present

## 2018-12-04 DIAGNOSIS — H2513 Age-related nuclear cataract, bilateral: Secondary | ICD-10-CM | POA: Diagnosis not present

## 2018-12-11 DIAGNOSIS — G5601 Carpal tunnel syndrome, right upper limb: Secondary | ICD-10-CM | POA: Diagnosis not present

## 2019-01-23 DIAGNOSIS — G5601 Carpal tunnel syndrome, right upper limb: Secondary | ICD-10-CM | POA: Diagnosis not present

## 2019-01-23 HISTORY — PX: OTHER SURGICAL HISTORY: SHX169

## 2019-02-05 DIAGNOSIS — Z5189 Encounter for other specified aftercare: Secondary | ICD-10-CM | POA: Diagnosis not present

## 2019-02-05 DIAGNOSIS — G5601 Carpal tunnel syndrome, right upper limb: Secondary | ICD-10-CM | POA: Diagnosis not present

## 2019-03-13 DIAGNOSIS — M79641 Pain in right hand: Secondary | ICD-10-CM | POA: Diagnosis not present

## 2019-04-16 DIAGNOSIS — M25511 Pain in right shoulder: Secondary | ICD-10-CM | POA: Diagnosis not present

## 2019-04-16 DIAGNOSIS — H903 Sensorineural hearing loss, bilateral: Secondary | ICD-10-CM | POA: Diagnosis not present

## 2019-04-16 DIAGNOSIS — N5201 Erectile dysfunction due to arterial insufficiency: Secondary | ICD-10-CM | POA: Diagnosis not present

## 2019-04-29 DIAGNOSIS — H903 Sensorineural hearing loss, bilateral: Secondary | ICD-10-CM | POA: Diagnosis not present

## 2019-04-30 DIAGNOSIS — G5601 Carpal tunnel syndrome, right upper limb: Secondary | ICD-10-CM | POA: Diagnosis not present

## 2019-05-01 DIAGNOSIS — H6123 Impacted cerumen, bilateral: Secondary | ICD-10-CM | POA: Diagnosis not present

## 2019-05-01 DIAGNOSIS — H906 Mixed conductive and sensorineural hearing loss, bilateral: Secondary | ICD-10-CM | POA: Diagnosis not present

## 2019-05-13 DIAGNOSIS — M25511 Pain in right shoulder: Secondary | ICD-10-CM | POA: Diagnosis not present

## 2019-05-13 DIAGNOSIS — M7501 Adhesive capsulitis of right shoulder: Secondary | ICD-10-CM | POA: Diagnosis not present

## 2019-05-20 DIAGNOSIS — M25511 Pain in right shoulder: Secondary | ICD-10-CM | POA: Diagnosis not present

## 2019-05-22 DIAGNOSIS — H903 Sensorineural hearing loss, bilateral: Secondary | ICD-10-CM | POA: Diagnosis not present

## 2019-05-29 DIAGNOSIS — M25511 Pain in right shoulder: Secondary | ICD-10-CM | POA: Diagnosis not present

## 2019-06-11 DIAGNOSIS — G5601 Carpal tunnel syndrome, right upper limb: Secondary | ICD-10-CM | POA: Diagnosis not present

## 2019-06-19 DIAGNOSIS — M7501 Adhesive capsulitis of right shoulder: Secondary | ICD-10-CM

## 2019-06-19 HISTORY — DX: Adhesive capsulitis of right shoulder: M75.01

## 2019-06-20 DIAGNOSIS — M7501 Adhesive capsulitis of right shoulder: Secondary | ICD-10-CM | POA: Diagnosis not present

## 2019-09-13 DIAGNOSIS — D225 Melanocytic nevi of trunk: Secondary | ICD-10-CM | POA: Diagnosis not present

## 2019-09-13 DIAGNOSIS — Z85820 Personal history of malignant melanoma of skin: Secondary | ICD-10-CM | POA: Diagnosis not present

## 2019-09-13 DIAGNOSIS — L723 Sebaceous cyst: Secondary | ICD-10-CM | POA: Diagnosis not present

## 2019-09-13 DIAGNOSIS — B360 Pityriasis versicolor: Secondary | ICD-10-CM | POA: Diagnosis not present

## 2019-09-13 DIAGNOSIS — D2271 Melanocytic nevi of right lower limb, including hip: Secondary | ICD-10-CM | POA: Diagnosis not present

## 2019-09-13 DIAGNOSIS — L84 Corns and callosities: Secondary | ICD-10-CM | POA: Diagnosis not present

## 2019-09-13 DIAGNOSIS — Z23 Encounter for immunization: Secondary | ICD-10-CM | POA: Diagnosis not present

## 2019-09-13 DIAGNOSIS — D485 Neoplasm of uncertain behavior of skin: Secondary | ICD-10-CM | POA: Diagnosis not present

## 2019-09-13 DIAGNOSIS — B353 Tinea pedis: Secondary | ICD-10-CM | POA: Diagnosis not present

## 2019-09-13 DIAGNOSIS — L820 Inflamed seborrheic keratosis: Secondary | ICD-10-CM | POA: Diagnosis not present

## 2019-09-13 DIAGNOSIS — L821 Other seborrheic keratosis: Secondary | ICD-10-CM | POA: Diagnosis not present

## 2019-09-13 DIAGNOSIS — Z85828 Personal history of other malignant neoplasm of skin: Secondary | ICD-10-CM | POA: Diagnosis not present

## 2019-09-17 DIAGNOSIS — Z20828 Contact with and (suspected) exposure to other viral communicable diseases: Secondary | ICD-10-CM | POA: Diagnosis not present

## 2019-09-26 DIAGNOSIS — G5601 Carpal tunnel syndrome, right upper limb: Secondary | ICD-10-CM | POA: Diagnosis not present

## 2019-09-30 DIAGNOSIS — Z0000 Encounter for general adult medical examination without abnormal findings: Secondary | ICD-10-CM | POA: Diagnosis not present

## 2019-09-30 DIAGNOSIS — E559 Vitamin D deficiency, unspecified: Secondary | ICD-10-CM | POA: Diagnosis not present

## 2019-09-30 DIAGNOSIS — R5383 Other fatigue: Secondary | ICD-10-CM | POA: Diagnosis not present

## 2019-09-30 DIAGNOSIS — Z23 Encounter for immunization: Secondary | ICD-10-CM | POA: Diagnosis not present

## 2019-09-30 DIAGNOSIS — Z125 Encounter for screening for malignant neoplasm of prostate: Secondary | ICD-10-CM | POA: Diagnosis not present

## 2019-09-30 DIAGNOSIS — R413 Other amnesia: Secondary | ICD-10-CM | POA: Diagnosis not present

## 2019-09-30 DIAGNOSIS — E7800 Pure hypercholesterolemia, unspecified: Secondary | ICD-10-CM | POA: Diagnosis not present

## 2019-09-30 DIAGNOSIS — R279 Unspecified lack of coordination: Secondary | ICD-10-CM | POA: Diagnosis not present

## 2019-10-02 ENCOUNTER — Other Ambulatory Visit: Payer: Self-pay | Admitting: Family Medicine

## 2019-10-02 DIAGNOSIS — R279 Unspecified lack of coordination: Secondary | ICD-10-CM

## 2019-10-07 DIAGNOSIS — G5601 Carpal tunnel syndrome, right upper limb: Secondary | ICD-10-CM | POA: Diagnosis not present

## 2019-10-10 ENCOUNTER — Other Ambulatory Visit: Payer: Self-pay | Admitting: Family Medicine

## 2019-10-10 DIAGNOSIS — M5412 Radiculopathy, cervical region: Secondary | ICD-10-CM

## 2019-10-15 DIAGNOSIS — R279 Unspecified lack of coordination: Secondary | ICD-10-CM | POA: Diagnosis not present

## 2019-10-15 DIAGNOSIS — M542 Cervicalgia: Secondary | ICD-10-CM | POA: Diagnosis not present

## 2019-10-28 ENCOUNTER — Other Ambulatory Visit: Payer: PPO

## 2019-10-28 ENCOUNTER — Encounter: Payer: Self-pay | Admitting: *Deleted

## 2019-10-29 ENCOUNTER — Ambulatory Visit: Payer: PPO | Admitting: Neurology

## 2019-10-29 ENCOUNTER — Encounter: Payer: Self-pay | Admitting: Neurology

## 2019-10-29 ENCOUNTER — Other Ambulatory Visit: Payer: Self-pay

## 2019-10-29 VITALS — BP 122/76 | HR 64 | Temp 97.6°F | Ht 68.0 in | Wt 133.5 lb

## 2019-10-29 DIAGNOSIS — R4701 Aphasia: Secondary | ICD-10-CM

## 2019-10-29 DIAGNOSIS — R292 Abnormal reflex: Secondary | ICD-10-CM | POA: Diagnosis not present

## 2019-10-29 DIAGNOSIS — G3184 Mild cognitive impairment, so stated: Secondary | ICD-10-CM

## 2019-10-29 DIAGNOSIS — R278 Other lack of coordination: Secondary | ICD-10-CM | POA: Diagnosis not present

## 2019-10-29 DIAGNOSIS — R482 Apraxia: Secondary | ICD-10-CM | POA: Diagnosis not present

## 2019-10-29 DIAGNOSIS — R9089 Other abnormal findings on diagnostic imaging of central nervous system: Secondary | ICD-10-CM | POA: Insufficient documentation

## 2019-10-29 NOTE — Progress Notes (Addendum)
SLEEP MEDICINE CLINIC    Provider:  Larey Seat, MD  Primary Care Physician:  Shirline Frees, MD [place] [address]     Referring Provider: Dr. Apolonio Schneiders, MD          Chief Complaint according to patient   Patient presents with:     New Patient (Initial Visit)           HISTORY OF PRESENT ILLNESS:  Alexander Cummings is a 73 y.o. year old White or Caucasian male patient seen here as an URGENT  referral on 10/29/2019 from Dr Apolonio Schneiders* for a neck-spine and abnormal brain MRI evaluation, while the family is concerned about a memory loss.  Chief concern according to patient : abnormal MRI of cervical spine is not families main concern.    I have the pleasure of seeing Alexander Cummings today, a right -handed White or Caucasian male with a Master's degree in accounting- MBA.  He  has a past medical history of Adhesive capsulitis of right shoulder (06/19/2019), BCC (basal cell carcinoma), Carpal tunnel syndrome, Kidney stones, Osteoarthritis, Recurrent upper respiratory infection (URI), and Tinea versicolor.   Family noted symptoms about a year ago, progressing over the last 72 month [name]- RN and daughter). Wife noted decrease in weight over 24 month, losing 20 pounds. Noted word finding problems, stumbling and stiffness, clumsiness, and cognitive impairment. "playing charades' at home trying to finish his sentences. Apraxia with common tasks  like how to use a credit card reader.   Changing of lanes when driving- wife bought a lane alarm.  Forgets turn signal.  Anosmia and ageusia after a fall from a ladder- March 13, 2016 MRI is in EPIC- no abnormalities.  New MRI images are not available- speaks of vast changes. .   Family medical  history: Mother died at age 27, late onset dementia. Father died at 52 Nov.1st 03/13/2018.    Social history: Patient  retired from [employer] and lives in a household with 2 persons. Family status is married , with adult children,  grandchildren. One dog.   Tobacco use; never.  ETOH use : one beer a week. Caffeine intake in form of Coffee( 2 cups ) Soda( rare) Tea ( rare ) or energy drinks. Regular exercise in form of tennis, golf.  Walking the dog.  Hobbies : tennis.      Review of Systems: Out of a complete 14 system review, the patient complains of only the following symptoms, and all other reviewed systems are negative.: MMSE 28-30 here and 26-30 at Dr. Kenton Kingfisher.   Dr. Sherwood Gambler did Surgery March 14, 2011 lumbar spine. NCV and EMG with Dr Nelva Bush. Delayed nerve conduction medial nerve right side, which is expected post cap rpal tunnel op.    Social History   Socioeconomic History   Marital status: Married    Spouse name: Not on file   Number of children: Not on file   Years of education: Not on file   Highest education level: Not on file  Occupational History   Not on file  Social Needs   Financial resource strain: Not on file   Food insecurity    Worry: Not on file    Inability: Not on file   Transportation needs    Medical: Not on file    Non-medical: Not on file  Tobacco Use   Smoking status: Never Smoker   Smokeless tobacco: Never Used  Substance and Sexual Activity   Alcohol  use: Yes    Alcohol/week: 1.0 standard drinks    Types: 1 Cans of beer per week   Drug use: No   Sexual activity: Yes  Lifestyle   Physical activity    Days per week: Not on file    Minutes per session: Not on file   Stress: Not on file  Relationships   Social connections    Talks on phone: Not on file    Gets together: Not on file    Attends religious service: Not on file    Active member of club or organization: Not on file    Attends meetings of clubs or organizations: Not on file    Relationship status: Not on file  Other Topics Concern   Not on file  Social History Narrative   Not on file    Family History  Problem Relation Age of Onset   Pancreatic cancer Brother 17       dx'd 5 months  before death   Dementia Mother     Past Medical History:  Diagnosis Date   Adhesive capsulitis of right shoulder 06/19/2019   BCC (basal cell carcinoma)    face   Carpal tunnel syndrome    right wrist   Kidney stones    Osteoarthritis    back, knees, neck   Recurrent upper respiratory infection (URI)    gets colds infrequently...   Tinea versicolor     Past Surgical History:  Procedure Laterality Date   COLONOSCOPY     LUMBAR LAMINECTOMY/DECOMPRESSION MICRODISCECTOMY  10/03/2011   Procedure: LUMBAR LAMINECTOMY/DECOMPRESSION MICRODISCECTOMY;  Surgeon: Hosie Spangle;  Location: [facility];  Service: Neurosurgery;  Laterality: Left;  LEFT Lumbar four-five EXTRAFORAMINAL MICRODISKECTOMY   MOHS SURGERY  01/2014   right ctr  01/23/2019   TONSILLECTOMY     VASECTOMY       Current Outpatient Medications on File Prior to Visit  Medication Sig Dispense Refill   Cholecalciferol (VITAMIN D3 PO) Take by mouth.     Cyanocobalamin (B-12 PO) Take by mouth.     simvastatin (ZOCOR) 20 MG tablet Take 20 mg by mouth daily.       No current facility-administered medications on file prior to visit.     No Known Allergies  Physical exam:  Today's Vitals   10/29/19 0849  BP: 122/76  Pulse: 64  Temp: 97.6 F (36.4 C)  Weight: 133 lb 8 oz (60.6 kg)  Height: 5\' 8"  (1.727 m)   Body mass index is 20.3 kg/m.   Wt Readings from Last 3 Encounters:  10/29/19 133 lb 8 oz (60.6 kg)  09/20/18 130 lb (59 kg)  07/17/14 147 lb (66.7 kg)     Ht Readings from Last 3 Encounters:  10/29/19 5\' 8"  (1.727 m)  09/20/18 5\' 7"  (1.702 m)  07/17/14 5\' 8"  (1.727 m)      General: The patient is awake, alert and appears not in acute distress. The patient is well groomed. Head: Normocephalic, atraumatic. Neck is supple. Mallampati 1- wide open with retrognathia.,  neck circumference 15.5  inches . Nasal airflow patent.  Dental status: intact.  Cardiovascular:  Regular rate and  cardiac rhythm by pulse,  without distended neck veins. Respiratory: Lungs are clear to auscultation.  Skin:  Without evidence of ankle edema, or rash. Trunk: The patient's posture is erect.   Neurologic exam : The patient is awake and alert, oriented to place and time.   Memory subjective described as intact.  Attention  span & concentration ability appears normal.  Speech is fluent,  without  dysarthria, dysphonia or aphasia.  Mood and affect are appropriate.   Cranial nerves: no loss of smell or taste reported  Pupils are equal and briskly reactive to light. Funduscopic exam intact.  Extraocular movements in vertical and horizontal planes were intact and without nystagmus. No Diplopia. Visual fields by finger perimetry are intact. Hearing was intact to soft voice and finger rubbing.    Facial sensation intact to fine touch.  Facial motor strength is symmetric and tongue and uvula move midline.  Neck ROM : rotation, tilt and flexion extension were normal for age and shoulder shrug was symmetrical.    Motor exam:  Symmetric bulk, tone and ROM.   Waxy increased tone without cog wheeling, symmetric grip strength- reduced.  .   Sensory:  Fine touch, pinprick and vibration were tested  and normal.  Proprioception tested in the upper extremities was normal.   Coordination: Rapid alternating movements in the fingers/hands were slowed The Finger-to-nose maneuver was intact without evidence of ataxia, dysmetria or tremor.   Gait and station: Patient could rise unassisted from a seated position, walked without assistive device. Increased width, normal arm swing, slightly stooped.  Stance is of normal width/ base and the patient turned with 4 steps.  Toe and heel walk were intact. Deep tendon reflexes: in the upper and lower extremities are symmetric and very brisk- crossed reflex reaction . Babinski response was deferred.       After spending a total time of  50  minutes face to face and  additional time for physical and neurologic examination, review of laboratory studies,  personal review of imaging studies and discussion with the family in person and on the phone (daughter Colletta [place]) , reports and results of other testing and review of referral information / records as far as provided in visit, I have established the following assessments:   Based on the patient's referring records from his primary care physician he has normal metabolic function he was diagnosed only with a vitamin D deficiency which is a very common finding, he has pure hypercholesterolemia and Mini-Mental exam at his office was 26/30 points and today 28/30 points with significant impairment for the sentence structure and visual-spatial impairment in clock drawing.  Calcium was 10.6 elevated total bilirubin was 1.5 which is also elevated by the his other liver function tests were normal.  His creatinine was 0.73 his testosterone was in normal range, his sedimentation rate was 2 mm/h his MCV was 95 mildly elevated he added the history of anosmia, sensorineural hearing loss in both ears compensated by a hearing aid which he is wearing today and a foot degree atrioventricular block, cervical spine osteoarthritis, the MRI was more concerning to me as it showed" periventricular and subcortical white matter foci nonspecific these was present white matter disease and is age-related.  But there is sulcal widening asymmetrically more pronounced within the parietal lobe left greater than right with mild ventricular enlargement.  Major intracranial flow voids was normal.  So there is no evidence of an infarction but of a neurodegenerative process that has to have started more than a year ago.   The asymmetry could relate to multisystem atrophy, can be seen in some forms of Alzheimer's type dementia, but given the visual-spatial component I would have expected an occipital lobe involvement.  For this reason I will order an MRI of the  brain with contrast we will perform this MRI  with in the epic system at [facility] imaging at the ambulatory MRI.  The recovery of 2 points on the Mini-Mental status examination is great but for a gentleman with a masters degree this time test is probably underwhelming.  When we meet again I will perform a MOCA/ [place] cognitive assessment exam.  This will be much more difficult and detailed.    My Plan is to proceed with:  1) MRI contrast, brain is needed to d further evaluate cognitive progressive dysfunction.  2) no visual hallucinations, but hyperreflexia. explained by cervical spine canal  stenosis.   3)  Anosmia for 2-3 years-  Started after fall from a ladder?? May be first sign of CNS degenerative process?   I would like to thank Shirline Frees, MD and Dr. Apolonio Schneiders, MD  for allowing me to meet with and to take care of this pleasant patient.  In short, Alexander Cummings is presenting with cognitive dysfunction, a symptom that can be attributed to dysfunction in left brain hemisphere for unknown reasons.  I plan to follow up either personally or through our NP within 1 month. With MOC[facility] Cognitive Assessment)    CC: I will share my notes with PCP/ orthopedist      Addendum 11-04-2019-   Dear Dr. Kenton Kingfisher and Dr. Apolonio Schneiders,  I was finally able to review the MRI from 15 October 2019.  This is a patient who has a history of right carpal tunnel surgery, fell from a ladder approximately 30 months ago which resulted in loss of sense of smell after head injury.  He continued to have a right upper extremity weakness numbness in the ulnar fingers and complains about dropping objects.  His cervical MRI was performed on 15 October 2019.  The interspace between C1 and 2 at the craniocervical junction was unremarkable as was C2-C3.  There is already moderate spinal canal stenosis noted with flattening of the cord between the third and fourth cervical vertebra and severe left uncovertebral  osteophytosis with severe left-sided facet arthrosis.  The neural exit foramina are narrow more severe on the left than on the right.  This continues to C3-C5 with moderate bulging, a narrowed canal, with moderate severe left-sided facet arthrosis moderate spinal canal stenosis severe left uncovertebral osteophytosis and neural foraminal narrowing.  C5 and C6 have the same findings moderate severe left uncovertebral osteophytosis at this time severe left facet arthrosis but no significant spinal stenosis severe left-sided foraminal from foraminal narrowing here the findings on the right and milder than on the left.  There was no spinal canal stenosis noted between the sixth and seventh vertebra.  These findings of partially severe foraminal cervical stenosis and spinal stenosis could explain left-sided pain syndrome, hyperreflexia, left extremity weakness or dysesthesias.   MRI brain of the same date without contrast showed asymmetric left parietal atrophy with sulci widening accordingly in distribution left over right.   Electronically signed by: Larey Seat, MD 10/29/2019 9:23 AM  [facility] Board certified by The [employer] of Sleep Medicine and Diplomate of the [employer] of Sleep Medicine. Board certified In Neurology through the [place], Fellow of the [employer] of Neurology. Medical Director of [employer].

## 2019-10-29 NOTE — Patient Instructions (Signed)

## 2019-11-19 ENCOUNTER — Other Ambulatory Visit: Payer: Self-pay

## 2019-11-19 ENCOUNTER — Ambulatory Visit
Admission: RE | Admit: 2019-11-19 | Discharge: 2019-11-19 | Disposition: A | Discharge status: Home / Self Care | Payer: PPO | Source: Ambulatory Visit | Attending: Neurology | Admitting: Neurology

## 2019-11-19 DIAGNOSIS — R9089 Other abnormal findings on diagnostic imaging of central nervous system: Secondary | ICD-10-CM

## 2019-11-19 DIAGNOSIS — G3184 Mild cognitive impairment, so stated: Secondary | ICD-10-CM

## 2019-11-19 DIAGNOSIS — R292 Abnormal reflex: Secondary | ICD-10-CM

## 2019-11-19 DIAGNOSIS — R4701 Aphasia: Secondary | ICD-10-CM

## 2019-11-19 DIAGNOSIS — R482 Apraxia: Secondary | ICD-10-CM

## 2019-11-19 DIAGNOSIS — R278 Other lack of coordination: Secondary | ICD-10-CM

## 2019-11-19 MED ORDER — GADOBENATE DIMEGLUMINE 529 MG/ML IV SOLN
12.0000 mL | Freq: Once | INTRAVENOUS | Status: AC | PRN
  Administered 2019-11-19: 12 mL via INTRAVENOUS

## 2019-11-25 ENCOUNTER — Telehealth: Payer: Self-pay

## 2019-11-25 NOTE — Telephone Encounter (Signed)
Wife returned call. She will be waiting by the phone this time for the call back.

## 2019-11-25 NOTE — Progress Notes (Signed)
No evidence of mass effect or midline shift.  Scattered periventricular and subcortical chronic small vessel ischemic disease. No abnormal lesions on post-contrast views.   On sagittal views the posterior fossa, pituitary gland and corpus callosum are unremarkable. No evidence of intracranial hemorrhage on SWI views. The orbits and their contents, paranasal sinuses and calvarium are unremarkable.  Intracranial flow voids are present.   IMPRESSION:   MRI brain (with and without) demonstrating: - Mild atrophy and mild chronic small vessel ischemic disease. Stable from 2017.  - No acute findings.

## 2019-11-25 NOTE — Telephone Encounter (Signed)
Left vm for patients wife on dpr to call back about MRI brain results.

## 2019-11-25 NOTE — Telephone Encounter (Signed)
I called pts wife on dpr. I stated that MRI brain (with and without) demonstrating: - Mild atrophy and mild chronic small vessel ischemic disease. Stable from 2017.  - No acute findings. The wife stated her husband had another scan done prior to this scan and another MD told her it should dementia. I stated those findings can be discuss with Dr. Brett Fairy at the next visit In December 03, 2019. She verbalized understanding.   Marval Regal, RN

## 2019-12-03 ENCOUNTER — Ambulatory Visit: Payer: PPO | Admitting: Neurology

## 2019-12-10 ENCOUNTER — Other Ambulatory Visit: Payer: Self-pay

## 2019-12-10 ENCOUNTER — Ambulatory Visit: Payer: PPO | Admitting: Neurology

## 2019-12-10 ENCOUNTER — Encounter: Payer: Self-pay | Admitting: Neurology

## 2019-12-10 VITALS — BP 124/65 | HR 53 | Temp 97.3°F | Ht 68.0 in | Wt 134.0 lb

## 2019-12-10 DIAGNOSIS — R482 Apraxia: Secondary | ICD-10-CM | POA: Diagnosis not present

## 2019-12-10 DIAGNOSIS — G3184 Mild cognitive impairment, so stated: Secondary | ICD-10-CM

## 2019-12-10 DIAGNOSIS — F0391 Unspecified dementia with behavioral disturbance: Secondary | ICD-10-CM | POA: Diagnosis not present

## 2019-12-10 DIAGNOSIS — R9089 Other abnormal findings on diagnostic imaging of central nervous system: Secondary | ICD-10-CM

## 2019-12-10 DIAGNOSIS — F03918 Unspecified dementia, unspecified severity, with other behavioral disturbance: Secondary | ICD-10-CM | POA: Insufficient documentation

## 2019-12-10 DIAGNOSIS — R292 Abnormal reflex: Secondary | ICD-10-CM | POA: Diagnosis not present

## 2019-12-10 DIAGNOSIS — R4701 Aphasia: Secondary | ICD-10-CM | POA: Diagnosis not present

## 2019-12-10 MED ORDER — DONEPEZIL HCL 5 MG PO TABS: 5.0000 mg | ORAL_TABLET | Freq: Every day | ORAL | 0 refills | Status: DC

## 2019-12-10 NOTE — Progress Notes (Signed)
Provider:  Larey Seat, MD  Primary Care Physician:  Alexander Frees, MD [place] [id number]     Referring Provider: Dr. Apolonio Schneiders, MD          Chief Complaint according to patient   Patient presents with:    . New Patient (Initial Visit)           HISTORY OF PRESENT ILLNESS:  Alexander Cummings is a 74 y.o. year old White or Caucasian male patient seen 12/10/2019 in a RV.  Wife is here in person, daughter is on the phone. The patient is defensive.  His MOCA was 23/ 30 , his handwriting is changed, his word output is reduced.  His MRI brain showed left sided atrophy, which involves the word-finding part. He replaces some words with similar words, such as body temperature with pulse,   We are now repeating an NCV and EMG to help differentiate his dominant hand/ arm dysfunction from neck injury/ DDD or from CNS changes. Also referral to neuropsychology testing . Mental status / memory- short term memory loss. He had failed 4-5 years ago a memory test for long term care insurance.  He has progressed over the last 6 month , according to his RN daughter, Alexander Cummings.     10-29-2019 He was seen here as an URGENT  referral on  from Dr Alexander Cummings* for a neck-spine and abnormal brain MRI evaluation, while the family is concerned about a memory loss.  Chief concern according to patient : abnormal MRI of cervical spine is not families main concern.    I have the pleasure of seeing Alexander Cummings today, a right -handed White or Caucasian male with a Master's degree in accounting- MBA.  He  has a past medical history of Adhesive capsulitis of right shoulder (06/19/2019), BCC (basal cell carcinoma), Carpal tunnel syndrome, Kidney stones, Osteoarthritis, Recurrent upper respiratory infection (URI), and Tinea versicolor.   Family noted symptoms about a year ago, progressing over the last 52 month Alexander Cummings- RN and daughter). Wife noted decrease in weight  over 24 month, losing 20 pounds. Noted word finding problems, stumbling and stiffness, clumsiness, and cognitive impairment. "playing charades' at home trying to finish his sentences. Apraxia with common tasks  like how to use a credit card reader.   Changing of lanes when driving- wife bought a lane alarm.  Forgets turn signal.  Anosmia and ageusia after a fall from a ladder- 02/26/16 MRI is in [facility]- no abnormalities.  New MRI images are not available- speaks of vast changes. .   Family medical  history: Mother died at age 3, late onset dementia. Father died at 81 Nov.1st 02/25/2018.    Social history: Patient  retired from [employer] and lives in a household with 2 persons. Family status is married , with adult children, grandchildren. One dog.   Tobacco use; never.  ETOH use : one beer a week. Caffeine intake in form of Coffee( 2 cups ) Soda( rare) Tea ( rare ) or energy drinks. Regular exercise in form of tennis, golf.  Walking the dog.  Hobbies : tennis.      Review of Systems: Out of a complete 14 system review, the patient complains of only the following symptoms, and all other reviewed systems are negative.: MMSE 28-30 here and 26-30 at Dr. Kenton Cummings.   Dr. Sherwood Cummings did Surgery Feb 26, 2011 lumbar spine. NCV and EMG with Dr Alexander Cummings. Delayed nerve  conduction medial nerve right side, which is expected post cap rpal tunnel op.    Social History   Socioeconomic History  . Marital status: Married    Spouse name: Not on file  . Number of children: Not on file  . Years of education: Not on file  . Highest education level: Not on file  Occupational History  . Not on file  Tobacco Use  . Smoking status: Never Smoker  . Smokeless tobacco: Never Used  Substance and Sexual Activity  . Alcohol use: Yes    Alcohol/week: 1.0 standard drinks    Types: 1 Cans of beer per week  . Drug use: No  . Sexual activity: Yes  Other Topics Concern  . Not on file  Social History Narrative  . Not on file   Social  Determinants of Health   Financial Resource Strain:   . Difficulty of Paying Living Expenses: Not on file  Food Insecurity:   . Worried About Charity fundraiser in the Last Year: Not on file  . Ran Out of Food in the Last Year: Not on file  Transportation Needs:   . Lack of Transportation (Medical): Not on file  . Lack of Transportation (Non-Medical): Not on file  Physical Activity:   . Days of Exercise per Week: Not on file  . Minutes of Exercise per Session: Not on file  Stress:   . Feeling of Stress : Not on file  Social Connections:   . Frequency of Communication with Friends and Family: Not on file  . Frequency of Social Gatherings with Friends and Family: Not on file  . Attends Religious Services: Not on file  . Active Member of Clubs or Organizations: Not on file  . Attends Archivist Meetings: Not on file  . Marital Status: Not on file    Family History  Problem Relation Age of Onset  . Pancreatic cancer Brother 13       dx'd 5 months before death  . Dementia Mother     Past Medical History:  Diagnosis Date  . Adhesive capsulitis of right shoulder 06/19/2019  . BCC (basal cell carcinoma)    face  . Carpal tunnel syndrome    right wrist  . Kidney stones   . Osteoarthritis    back, knees, neck  . Recurrent upper respiratory infection (URI)    gets colds infrequently...  . Tinea versicolor     Past Surgical History:  Procedure Laterality Date  . COLONOSCOPY    . LUMBAR LAMINECTOMY/DECOMPRESSION MICRODISCECTOMY  10/03/2011   Procedure: LUMBAR LAMINECTOMY/DECOMPRESSION MICRODISCECTOMY;  Surgeon: Alexander Cummings;  Location: [facility];  Service: Neurosurgery;  Laterality: Left;  LEFT Lumbar four-five EXTRAFORAMINAL MICRODISKECTOMY  . MOHS SURGERY  01/2014  . right ctr  01/23/2019  . TONSILLECTOMY    . VASECTOMY       Current Outpatient Medications on File Prior to Visit  Medication Sig Dispense Refill  . Cholecalciferol (VITAMIN D3 PO) Take  by mouth.    . Cyanocobalamin (B-12 PO) Take by mouth.    . simvastatin (ZOCOR) 20 MG tablet Take 20 mg by mouth daily.       No current facility-administered medications on file prior to visit.    No Known Allergies  Physical exam:  Today's Vitals   12/10/19 1015  BP: 124/65  Pulse: (!) 53  Temp: (!) 97.3 F (36.3 C)  Weight: 134 lb (60.8 kg)  Height: 5\' 8"  (1.727 m)  Body mass index is 20.37 kg/m.   Wt Readings from Last 3 Encounters:  12/10/19 134 lb (60.8 kg)  10/29/19 133 lb 8 oz (60.6 kg)  09/20/18 130 lb (59 kg)     Ht Readings from Last 3 Encounters:  12/10/19 5\' 8"  (1.727 m)  10/29/19 5\' 8"  (1.727 m)  09/20/18 5\' 7"  (1.702 m)      General: The patient is awake, alert and appears not in acute distress. The patient is well groomed. Head: Normocephalic, atraumatic. Neck is supple. Mallampati 1- wide open with retrognathia.,  neck circumference 15.5  inches . Nasal airflow patent.  Dental status: intact.  Cardiovascular:  Regular rate and cardiac rhythm by pulse,  without distended neck veins. Respiratory: Lungs are clear to auscultation.  Skin:  Without evidence of ankle edema, or rash. Trunk: The patient's posture is erect.   Neurologic exam : The patient is awake and alert, oriented to place and time.   Memory subjective described as intact.  Attention span & concentration ability appears normal.  Speech is fluent,  without  dysarthria, dysphonia or aphasia.  Mood and affect are appropriate. Montreal Cognitive Assessment  12/10/2019  Visuospatial/ Executive (0/5) 5  Naming (0/3) 3  Attention: Read list of digits (0/2) 0  Attention: Read list of letters (0/1) 1  Attention: Serial 7 subtraction starting at 100 (0/3) 3  Language: Repeat phrase (0/2) 1  Language : Fluency (0/1) 1  Abstraction (0/2) 2  Delayed Recall (0/5) 1  Orientation (0/6) 6  Total 23      Cranial nerves: no loss of smell or taste reported  Pupils are equal and briskly reactive  to light. Funduscopic exam intact.  Extraocular movements in vertical and horizontal planes were intact and without nystagmus. No Diplopia. Visual fields by finger perimetry are intact. Hearing was intact to soft voice and finger rubbing.    Facial sensation intact to fine touch.  Facial motor strength is symmetric and tongue and uvula move midline.  Neck ROM : rotation, tilt and flexion extension were normal for age and shoulder shrug was symmetrical.    Motor exam:  Symmetric bulk, tone and ROM.   Waxy increased tone without cog wheeling, symmetric grip strength- reduced.  .   Sensory:  Fine touch, pinprick and vibration were tested  and normal.  Proprioception tested in the upper extremities was normal.   Coordination: Rapid alternating movements in the fingers/hands were slowed The Finger-to-nose maneuver was intact without evidence of ataxia, dysmetria or tremor.   Gait and station: Patient could rise unassisted from a seated position, walked without assistive device. Increased width, normal arm swing, slightly stooped.  Stance is of normal width/ base and the patient turned with 4 steps.  Toe and heel walk were intact. Deep tendon reflexes: in the upper and lower extremities are symmetric and very brisk- crossed reflex reaction . Babinski response was deferred.       After spending a total time of  25  minutes face to face and additional time for physical and neurologic examination, review of laboratory studies,  personal review of imaging studies and discussion with the family in person and on the phone (daughter Colletta [place]) , reports and results of other testing and review of referral information / records as far as provided in visit, I have established the following assessments:  We are now repeating an NCV and EMG to help differentiate his dominant hand/ arm dysfunction from neck injury/ DDD or from CNS changes. Also  referral to neuropsychology testing . Mental status / memory-  short term memory loss. He had failed 4-5 years ago a memory test for long term care insurance.   He has progressed over the last 6 month , according to his daughter, Alexander Klinefelter, RN.  I would like to thank Alexander Frees, MD and Dr. Apolonio Schneiders, MD  for allowing me to meet with and to take care of this pleasant patient.  In short, Alexander Cummings is presenting with cognitive dysfunction, a symptom that can be attributed to dysfunction and atrophy in left brain hemisphere.  I plan to follow up either personally or through our NP within 3 month.  CC: I will share my notes with PCP/ orthopedist   Alexander Seat, MD      Addendum  From 11-04-2019- Dear Dr. Kenton Cummings and Dr. Apolonio Cummings,  I was finally able to review the MRI from 15 October 2019.  This is a patient who has a history of right carpal tunnel surgery, fell from a ladder approximately 30 months ago which resulted in loss of sense of smell after head injury.  He continued to have a right upper extremity weakness numbness in the ulnar fingers and complains about dropping objects.  His cervical MRI was performed on 15 October 2019.  The interspace between C1 and 2 at the craniocervical junction was unremarkable as was C2-C3.  There is already moderate spinal canal stenosis noted with flattening of the cord between the third and fourth cervical vertebra and severe left uncovertebral osteophytosis with severe left-sided facet arthrosis.  The neural exit foramina are narrow more severe on the left than on the right.  This continues to C3-C5 with moderate bulging, a narrowed canal, with moderate severe left-sided facet arthrosis moderate spinal canal stenosis severe left uncovertebral osteophytosis and neural foraminal narrowing.  C5 and C6 have the same findings moderate severe left uncovertebral osteophytosis at this time severe left facet arthrosis but no significant spinal stenosis severe left-sided foraminal from foraminal narrowing here the findings  on the right and milder than on the left.  There was no spinal canal stenosis noted between the sixth and seventh vertebra.  These findings of partially severe foraminal cervical stenosis and spinal stenosis could explain left-sided pain syndrome, hyperreflexia, left extremity weakness or dysesthesias.   MRI brain of the same date without contrast showed asymmetric left parietal atrophy with sulci widening accordingly in distribution left over right.   Electronically signed by: Alexander Seat, MD 12/10/2019 10:48 AM  [facility] Board certified by The [employer] of Sleep Medicine and Diplomate of the [employer] of Sleep Medicine. Board certified In Neurology through the [place], Fellow of the [employer] of Neurology. Medical Director of [facility].

## 2019-12-10 NOTE — Addendum Note (Signed)
Addended by: Larey Seat on: 12/10/2019 11:19 AM   Modules accepted: Orders

## 2019-12-10 NOTE — Patient Instructions (Addendum)
Cerebral Atrophy  Cerebral atrophy is a condition that causes brain tissue to shrink due to the loss of brain cells (neurons) and the loss of connections between neurons. Cerebral atrophy may affect the entire brain or only one area. Cerebral atrophy can affect speech and voluntary movements. It can also affect memory and the ability to do daily tasks. What are the causes? This condition may be caused by:  Dementia, including Alzheimer's disease, vascular dementia, and frontotemporal dementia.  Stroke.  Traumatic brain injury (TBI).  Multiple sclerosis.  Infections, such as HIV/AIDS, encephalitis, and syphilis.  Cerebral palsy.  Huntington's disease. In some cases, the cause may not be known. What increases the risk? You are more likely to develop this condition if you:  Are over age 21.  Have or have had a brain injury.  Have a family history of brain disease, such as Alzheimer's disease. What are the signs or symptoms? Symptoms of this condition can vary from person to person. Symptoms depend on which part of the brain is affected. Common symptoms of this condition include:  Muscle weakness.  Muscle movements made again and again that you cannot control (involuntary repetitive muscle movements).  Trouble swallowing.  Loss of ability to speak, understand speech, or both (aphasia).  Personality changes.  Progressive confusion and loss of memory that interfere with daily functioning (dementia). Other symptoms include:  Seizures.  Loss of consciousness. How is this diagnosed? This condition may be diagnosed based on your medical history and a physical exam. You may be referred to a specialist, called a neurologist, who works with this condition. You may need to have tests, including:  Blood tests.  Tests to evaluate your mental functions. You may also have other tests, including:  MRI.  CT scan.  PET scan.  SPECT scan. How is this treated? There is no  cure for this condition, but treatment can help to control symptoms and preserve brain function. A team of health care providers will work closely with you and your caregivers to find the best treatment for you. Treatment for this condition can include:  Medicines to treat symptoms and any known causes of this condition.  Emotional support and counseling.  Regular visits with therapists. This may include physical therapy, speech therapy, and occupational therapy.  Social worker support.  Communication and memory devices.  A cane, walker, or wheelchair to help you move around safely. Follow these instructions at home: Eating and drinking  Do not drink alcohol if your health care provider tells you not to drink.  Eat a healthy diet that is high in omega-3 fatty acids and antioxidants. If you have questions about what to eat, ask your health care provider. Lifestyle  Do not use any products that contain nicotine or tobacco, such as cigarettes, e-cigarettes, and chewing tobacco. If you need help quitting, ask your health care provider.  Follow a schedule for sleeping, eating, and exercising as told by your health care provider.  Keep your mind active with stimulating activities you enjoy, such as reading or playing games.  Practice stress-management techniques when you get stressed. If you need help managing stress, ask your health care provider. General instructions  Take over-the-counter and prescription medicines only as told by your health care provider.  Work closely with your team of health care providers to create a plan of care that works best for you and your caregivers and keeps you safe.  Keep all follow-up visits as told by your health care provider. This is  important. Contact a health care provider if you:  Have a cough.  Have a fever.  Have trouble swallowing.  Lose weight.  Are not able to care for yourself at home.  Feel anxious or depressed.  Have any new  symptoms. Get help right away if you:  Develop new or worsening confusion.  Do not feel safe at home.  Have thoughts about hurting yourself or others. If you ever feel like you may hurt yourself or others, or have thoughts about taking your own life, get help right away. You can go to your nearest emergency department or call:  Your local emergency services (911 in the [place]  A suicide crisis helpline, such as the [facility] at 337-032-9342. This is open 24 hours a day. Summary  Cerebral atrophy is a condition that causes brain tissue to shrink due to a loss of brain cells (neurons) and the loss of connections between neurons.  Cerebral atrophy may affect the entire brain or only one area.  Cerebral atrophy can affect speech and voluntary movements. It can also affect memory and the ability to do daily functions.  There is no cure for this condition, but treatment can help to control symptoms and preserve brain function. A team of health care providers will work closely with you and your caregivers to find the best treatment for you. This information is not intended to replace advice given to you by your health care provider. Make sure you discuss any questions you have with your health care provider. Document Revised: 01/23/2019 Document Reviewed: 01/23/2019 Elsevier Patient Education  [place]. Donepezil tablets What is this medicine? DONEPEZIL (doe NEP e zil) is used to treat mild to moderate dementia caused by Alzheimer's disease. This medicine may be used for other purposes; ask your health care provider or pharmacist if you have questions. COMMON BRAND NAME(S): Aricept What should I tell my health care provider before I take this medicine? They need to know if you have any of these conditions:  asthma or other lung disease  difficulty passing urine  head injury  heart disease  history of irregular heartbeat  liver  disease  seizures (convulsions)  stomach or intestinal disease, ulcers or stomach bleeding  an unusual or allergic reaction to donepezil, other medicines, foods, dyes, or preservatives  pregnant or trying to get pregnant  breast-feeding How should I use this medicine? Take this medicine by mouth with a glass of water. Follow the directions on the prescription label. You may take this medicine with or without food. Take this medicine at regular intervals. This medicine is usually taken before bedtime. Do not take it more often than directed. Continue to take your medicine even if you feel better. Do not stop taking except on your doctor's advice. If you are taking the 23 mg donepezil tablet, swallow it whole; do not cut, crush, or chew it. Talk to your pediatrician regarding the use of this medicine in children. Special care may be needed. Overdosage: If you think you have taken too much of this medicine contact a poison control center or emergency room at once. NOTE: This medicine is only for you. Do not share this medicine with others. What if I miss a dose? If you miss a dose, take it as soon as you can. If it is almost time for your next dose, take only that dose, do not take double or extra doses. What may interact with this medicine? Do not take this medicine  with any of the following medications:  certain medicines for fungal infections like itraconazole, fluconazole, posaconazole, and voriconazole  cisapride  dextromethorphan; quinidine  dronedarone  pimozide  quinidine  thioridazine This medicine may also interact with the following medications:  antihistamines for allergy, cough and cold  atropine  bethanechol  carbamazepine  certain medicines for bladder problems like oxybutynin, tolterodine  certain medicines for Parkinson's disease like benztropine, trihexyphenidyl  certain medicines for stomach problems like dicyclomine, hyoscyamine  certain medicines  for travel sickness like scopolamine  dexamethasone  dofetilide  ipratropium  NSAIDs, medicines for pain and inflammation, like ibuprofen or naproxen  other medicines for Alzheimer's disease  other medicines that prolong the QT interval (cause an abnormal heart rhythm)  phenobarbital  phenytoin  rifampin, rifabutin or rifapentine  ziprasidone This list may not describe all possible interactions. Give your health care provider a list of all the medicines, herbs, non-prescription drugs, or dietary supplements you use. Also tell them if you smoke, drink alcohol, or use illegal drugs. Some items may interact with your medicine. What should I watch for while using this medicine? Visit your doctor or health care professional for regular checks on your progress. Check with your doctor or health care professional if your symptoms do not get better or if they get worse. You may get drowsy or dizzy. Do not drive, use machinery, or do anything that needs mental alertness until you know how this drug affects you. What side effects may I notice from receiving this medicine? Side effects that you should report to your doctor or health care professional as soon as possible:  allergic reactions like skin rash, itching or hives, swelling of the face, lips, or tongue  feeling faint or lightheaded, falls  loss of bladder control  seizures  signs and symptoms of a dangerous change in heartbeat or heart rhythm like chest pain; dizziness; fast or irregular heartbeat; palpitations; feeling faint or lightheaded, falls; breathing problems  signs and symptoms of infection like fever or chills; cough; sore throat; pain or trouble passing urine  signs and symptoms of liver injury like dark yellow or brown urine; general ill feeling or flu-like symptoms; light-colored stools; loss of appetite; nausea; right upper belly pain; unusually weak or tired; yellowing of the eyes or skin  slow heartbeat or  palpitations  unusual bleeding or bruising  vomiting Side effects that usually do not require medical attention (report to your doctor or health care professional if they continue or are bothersome):  diarrhea, especially when starting treatment  headache  loss of appetite  muscle cramps  nausea  stomach upset This list may not describe all possible side effects. Call your doctor for medical advice about side effects. You may report side effects to FDA at 1-800-FDA-1088. Where should I keep my medicine? Keep out of reach of children. Store at room temperature between 15 and 30 degrees C (59 and 86 degrees F). Throw away any unused medicine after the expiration date. NOTE: This sheet is a summary. It may not cover all possible information. If you have questions about this medicine, talk to your doctor, pharmacist, or health care provider.  2020 Elsevier/Gold Standard (2018-11-05 10:33:41)

## 2019-12-12 ENCOUNTER — Ambulatory Visit: Payer: PPO | Admitting: Neurology

## 2019-12-12 ENCOUNTER — Telehealth: Payer: Self-pay

## 2019-12-12 ENCOUNTER — Other Ambulatory Visit: Payer: Self-pay | Admitting: Neurology

## 2019-12-12 MED ORDER — DONEPEZIL HCL 5 MG PO TABS: 5.0000 mg | ORAL_TABLET | Freq: Every day | ORAL | 0 refills | Status: DC

## 2019-12-12 NOTE — Telephone Encounter (Signed)
1) Medication(s) Requested (by name): donepezil (ARICEPT) 5 MG tablet   2) Pharmacy of Choice:  [facility]  [id number]     Patient requested a CB once sent

## 2019-12-12 NOTE — Telephone Encounter (Signed)
Pt medication has been resent to the [facility] for him.

## 2019-12-13 DIAGNOSIS — G5601 Carpal tunnel syndrome, right upper limb: Secondary | ICD-10-CM | POA: Diagnosis not present

## 2019-12-20 NOTE — Progress Notes (Signed)
Left more than Right Brain Atrophy

## 2019-12-25 ENCOUNTER — Ambulatory Visit (INDEPENDENT_AMBULATORY_CARE_PROVIDER_SITE_OTHER): Payer: PPO | Admitting: Neurology

## 2019-12-25 ENCOUNTER — Other Ambulatory Visit: Payer: Self-pay

## 2019-12-25 ENCOUNTER — Encounter (INDEPENDENT_AMBULATORY_CARE_PROVIDER_SITE_OTHER): Payer: PPO | Admitting: Neurology

## 2019-12-25 DIAGNOSIS — R292 Abnormal reflex: Secondary | ICD-10-CM

## 2019-12-25 DIAGNOSIS — R208 Other disturbances of skin sensation: Secondary | ICD-10-CM | POA: Diagnosis not present

## 2019-12-25 DIAGNOSIS — F0391 Unspecified dementia with behavioral disturbance: Secondary | ICD-10-CM

## 2019-12-25 DIAGNOSIS — G3184 Mild cognitive impairment, so stated: Secondary | ICD-10-CM

## 2019-12-25 DIAGNOSIS — R4701 Aphasia: Secondary | ICD-10-CM

## 2019-12-25 DIAGNOSIS — R482 Apraxia: Secondary | ICD-10-CM

## 2019-12-25 DIAGNOSIS — Z0289 Encounter for other administrative examinations: Secondary | ICD-10-CM

## 2019-12-25 DIAGNOSIS — R9089 Other abnormal findings on diagnostic imaging of central nervous system: Secondary | ICD-10-CM

## 2019-12-25 NOTE — Progress Notes (Signed)
The only abnormality  is mildly decreased recruitment at right abductor pollicis  brevis.   Conclusion:  This is an abnormal study. There is electrodiagnostic evidence  of mild slowing across the right wrist, consistent with diagnosis  of mild right carpal tunnel syndrome. There is no evidence of  right cervical radiculopathy.

## 2019-12-25 NOTE — Procedures (Signed)
Full Name: Alexander Cummings Gender: Male MRN #: [id number] Date of Birth: Apr 02, 1946    Visit Date: 12/25/2019 08:13 Age: 75 Years Examining Physician: Marcial Pacas, MD  Referring Physician: Larey Seat, MD History: 74 year old male with history of right carpal tunnel release surgery February 2020, continues to complain right hand cold sensation, radiating discomfort right forearm  Summary of the tests:  Nerve conduction study: Bilateral ulnar, left median sensory and motor responses were normal. Right median sensory response showed mildly prolonged peak latency, within normal range snap amplitude. Right median motor responses were normal.  Electromyography: Selected needle examination of right upper extremity, and right cervical paraspinal muscle were performed.  The only abnormality is mildly decreased recruitment at right abductor pollicis brevis.  Conclusion: This is an abnormal study.  There is electrodiagnostic evidence of mild slowing across the right wrist, consistent with diagnosis of mild right carpal tunnel syndrome.  There is no evidence of right cervical radiculopathy.    ------------------------------- Marcial Pacas, M.D. PhD  [facility] [place], [place] [address] Tel: [phone] Fax: [phone]         [facility]    Nerve / Sites Muscle Latency Ref. Amplitude Ref. Rel Amp Segments Distance Velocity Ref. Area    ms ms mV mV %  cm m/s m/s mVms  R Median - APB     Wrist APB 4.3 ?4.4 5.2 ?4.0 100 Wrist - APB 7   31.1     Upper arm APB 8.8  5.4  104 Upper arm - Wrist 24 54 ?49 30.8  L Median - APB     Wrist APB 3.6 ?4.4 6.7 ?4.0 100 Wrist - APB 7   32.6     Upper arm APB 7.8  6.5  97.1 Upper arm - Wrist 23 55 ?49 32.1  R Ulnar - ADM     Wrist ADM 3.2 ?3.3 12.9 ?6.0 100 Wrist - ADM 7   47.2     B.Elbow ADM 7.0  12.1  94.2 B.Elbow - Wrist 21 54 ?49 46.3     A.Elbow ADM 9.1  11.9  97.8 A.Elbow - B.Elbow 10 49 ?49 44.4   A.Elbow - Wrist      L Ulnar - ADM     Wrist ADM 2.9 ?3.3 9.8 ?6.0 100 Wrist - ADM 7   38.0     B.Elbow ADM 6.5  8.7  89.2 B.Elbow - Wrist 21 59 ?49 37.5     A.Elbow ADM 8.3  8.1  92.6 A.Elbow - B.Elbow 10 55 ?49 34.9         A.Elbow - Wrist                 SNC    Nerve / Sites Rec. Site Peak Lat Ref.  Amp Ref. Segments Distance    ms ms V V  cm  R Median - Orthodromic (Dig II, Mid palm)     Dig II Wrist 4.0 ?3.4 10 ?10 Dig II - Wrist 13  L Median - Orthodromic (Dig II, Mid palm)     Dig II Wrist 3.3 ?3.4 11 ?10 Dig II - Wrist 13  R Ulnar - Orthodromic, (Dig V, Mid palm)     Dig V Wrist 3.1 ?3.1 10 ?5 Dig V - Wrist 11  L Ulnar - Orthodromic, (Dig V, Mid palm)     Dig V Wrist 3.0 ?3.1 9 ?5 Dig V - Wrist 11  F  Wave    Nerve F Lat Ref.   ms ms  R Ulnar - ADM 29.2 ?32.0  L Ulnar - ADM 26.9 ?32.0         EMG Summary Table    Spontaneous MUAP Recruitment  Muscle IA Fib PSW Fasc Other Amp Dur. Poly Pattern  R. First dorsal interosseous Normal None None None _______ Normal Normal Normal Normal  R. Abductor pollicis brevis Normal None None None _______ Normal Normal Normal Reduced  R. Pronator teres Normal None None None _______ Normal Normal Normal Normal  R. Biceps brachii Normal None None None _______ Normal Normal Normal Normal  R. Deltoid Normal None None None _______ Normal Normal Normal Normal  R. Triceps brachii Normal None None None _______ Normal Normal Normal Normal  R. Brachioradialis Normal None None None _______ Normal Normal Normal Normal  R. Cervical paraspinals Normal None None None _______ Normal Normal Normal Normal

## 2019-12-26 ENCOUNTER — Encounter: Payer: Self-pay | Admitting: Neurology

## 2020-01-09 DIAGNOSIS — H2513 Age-related nuclear cataract, bilateral: Secondary | ICD-10-CM | POA: Diagnosis not present

## 2020-01-23 ENCOUNTER — Telehealth: Payer: Self-pay | Admitting: Neurology

## 2020-01-23 ENCOUNTER — Other Ambulatory Visit: Payer: Self-pay | Admitting: Neurology

## 2020-01-23 DIAGNOSIS — G5601 Carpal tunnel syndrome, right upper limb: Secondary | ICD-10-CM | POA: Diagnosis not present

## 2020-01-23 MED ORDER — DONEPEZIL HCL 5 MG PO TABS: 5.0000 mg | ORAL_TABLET | Freq: Every day | ORAL | 2 refills | Status: DC

## 2020-01-23 NOTE — Telephone Encounter (Signed)
Called the patient and advised I would send a refill for the patient. He was appreciative for the call.

## 2020-01-23 NOTE — Telephone Encounter (Signed)
Pt LVM stating he had ran out of medication for donepezil (ARICEPT) 5 MG tablet requesting a call to discuss a refill

## 2020-02-12 ENCOUNTER — Telehealth: Payer: Self-pay | Admitting: Neurology

## 2020-02-12 DIAGNOSIS — G5601 Carpal tunnel syndrome, right upper limb: Secondary | ICD-10-CM | POA: Diagnosis not present

## 2020-02-12 DIAGNOSIS — R413 Other amnesia: Secondary | ICD-10-CM | POA: Diagnosis not present

## 2020-02-12 DIAGNOSIS — M25511 Pain in right shoulder: Secondary | ICD-10-CM | POA: Diagnosis not present

## 2020-02-12 NOTE — Telephone Encounter (Signed)
Pt called wanting to know if he should still be getting the neurological test done before his upcoming apt

## 2020-02-13 NOTE — Telephone Encounter (Signed)
I spoke to patient and his wife 02/12/2020 . And relayed that there was no need to follow up until they had neuropsychology exam . I apologized to them because referral did not drop correctly . They understood and were very understanding. They were willing to drive to [place] to See Dr. Ane Payment where they can get in the few weeks   . Telephone [phone] - fax [phone] -Dr. Eulas Post will call patient's wife to schedule . Thanks Hinton Dyer

## 2020-02-13 NOTE — Telephone Encounter (Signed)
Pt requested a CB from RN to ensure if he should cancel his FU before getting his exam done states he would like MD to make the decision.

## 2020-02-13 NOTE — Telephone Encounter (Signed)
Called the patient back and spoke with him and his wife on speaker phone. They are scheduled 4/13 with the neuro cognitive MD. That will be up to 2 weeks of testing. I advised that the purpose of the visit is to have that information all together so when we follow up we have all the information. I have pushed the patient apt out to May 5,2021 at Dr Dohmeier at 9:30 am. I recommended that they ask for copies of notes from that MD office that way they can bring any information to our office in case for some reason we have not received information from the testing. They verbalized understanding.

## 2020-02-20 DIAGNOSIS — M7501 Adhesive capsulitis of right shoulder: Secondary | ICD-10-CM | POA: Diagnosis not present

## 2020-03-06 DIAGNOSIS — Z4789 Encounter for other orthopedic aftercare: Secondary | ICD-10-CM | POA: Diagnosis not present

## 2020-03-06 DIAGNOSIS — G5601 Carpal tunnel syndrome, right upper limb: Secondary | ICD-10-CM | POA: Diagnosis not present

## 2020-03-09 ENCOUNTER — Ambulatory Visit: Payer: PPO | Admitting: Adult Health

## 2020-04-01 ENCOUNTER — Ambulatory Visit: Payer: Self-pay | Admitting: Neurology

## 2020-04-01 DIAGNOSIS — G5601 Carpal tunnel syndrome, right upper limb: Secondary | ICD-10-CM | POA: Diagnosis not present

## 2020-04-01 DIAGNOSIS — G5611 Other lesions of median nerve, right upper limb: Secondary | ICD-10-CM | POA: Diagnosis not present

## 2020-04-01 DIAGNOSIS — M5412 Radiculopathy, cervical region: Secondary | ICD-10-CM | POA: Diagnosis not present

## 2020-04-15 DIAGNOSIS — G5601 Carpal tunnel syndrome, right upper limb: Secondary | ICD-10-CM | POA: Diagnosis not present

## 2020-04-20 ENCOUNTER — Ambulatory Visit: Payer: PPO | Admitting: Neurology

## 2020-04-20 ENCOUNTER — Other Ambulatory Visit: Payer: Self-pay

## 2020-04-20 ENCOUNTER — Encounter: Payer: Self-pay | Admitting: Neurology

## 2020-04-20 VITALS — BP 103/61 | HR 59 | Ht 68.0 in | Wt 130.0 lb

## 2020-04-20 DIAGNOSIS — F0391 Unspecified dementia with behavioral disturbance: Secondary | ICD-10-CM

## 2020-04-20 NOTE — Addendum Note (Signed)
Addended by: Larey Seat on: 04/20/2020 02:31 PM   Modules accepted: Orders

## 2020-04-20 NOTE — Addendum Note (Signed)
Addended by: Larey Seat on: 04/20/2020 02:32 PM   Modules accepted: Orders

## 2020-04-20 NOTE — Progress Notes (Signed)
Provider:  Larey Seat, MD  Primary Care Physician:  Shirline Frees, MD [place] [address]     Referring Provider: Dr. Apolonio Schneiders, MD          Chief Complaint according to patient   Patient presents with:    . New Patient (Initial Visit)           HISTORY OF PRESENT ILLNESS:  Alexander Cummings is a 74 y.o. year old White or Caucasian male patient seen on 04-20-2020, after neuropsychological testing . They had the neuro psych completed with Dr. Ane Payment, but have not heard about results yet.  We did not get results of these testing batteries. Carpal tunnel ? He has seen a Dr. Roberts Gaudy , MD, PhD,  orthopedist at [facility] - who wanted yet another NCV and EMG for this patient.  DR. Ramonita Lab had referred him.      12/10/2019 in a RV.  Wife is here in person, daughter is on the phone. The patient is defensive.  His MOCA was 23/ 30 , his handwriting is changed, his word output is reduced.  His MRI brain showed left sided atrophy, which involves the word-finding part. He replaces some words with similar words, such as body temperature with pulse,   We are now repeating an NCV and EMG to help differentiate his dominant hand/ arm dysfunction from neck injury/ DDD or from CNS changes. Also referral to neuropsychology testing . Mental status / memory- short term memory loss. He had failed 4-5 years ago a memory test for long term care insurance.  He has progressed over the last 6 month , according to his RN daughter, Alexander Cummings.     10-29-2019 He was seen here as an URGENT  referral on  from Dr Apolonio Schneiders* for a neck-spine and abnormal brain MRI evaluation, while the family is concerned about a memory loss.  Chief concern according to patient : abnormal MRI of cervical spine is not families main concern.    I have the pleasure of seeing Alexander Cummings today, a right -handed White or Caucasian male with a Master's degree in  accounting- MBA.  He  has a past medical history of Adhesive capsulitis of right shoulder (06/19/2019), BCC (basal cell carcinoma), Carpal tunnel syndrome, Kidney stones, Osteoarthritis, Recurrent upper respiratory infection (URI), and Tinea versicolor.   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 Wife noted decrease in weight over 24 month, losing 20 pounds. Noted word finding problems, stumbling and stiffness, clumsiness, and cognitive impairment. "playing charades' at home trying to finish his sentences. Apraxia with common tasks  like how to use a credit card reader.   Changing of lanes when driving- wife bought a lane alarm.  Forgets turn signal.  Anosmia and ageusia after a fall from a ladder- 02/25/2016 MRI is in EPIC- no abnormalities.  New MRI images are not available- speaks of vast changes. .   Family medical  history: Mother died at age 78, late onset dementia. Father died at 30 Nov.1st Feb 24, 2018.    Social history: Patient  retired from [employer] and lives in a household with 2 persons. Family status is married , with adult children, grandchildren. One dog.   Tobacco use; never.  ETOH use : one beer a week. Caffeine intake in form of Coffee( 2 cups ) Soda( rare) Tea ( rare )  or energy drinks. Regular exercise in form of tennis, golf.  Walking the dog.  Hobbies : tennis.      Review of Systems: Out of a complete 14 system review, the patient complains of only the following symptoms, and all other reviewed systems are negative.: MMSE 28-30 here and 26-30 at Dr. Kenton Kingfisher.   Dr. Sherwood Gambler did Surgery 2012 lumbar spine. NCV and EMG with Dr Nelva Bush. Delayed nerve conduction medial nerve right side, which is expected post cap rpal tunnel op.      Social History   Socioeconomic History  . Marital status: Married    Spouse name: Not on file  . Number of children: Not on file  . Years of education: Not on file  . Highest  education level: Not on file  Occupational History  . Not on file  Tobacco Use  . Smoking status: Never Smoker  . Smokeless tobacco: Never Used  Substance and Sexual Activity  . Alcohol use: Yes    Alcohol/week: 1.0 standard drinks    Types: 1 Cans of beer per week  . Drug use: No  . Sexual activity: Yes  Other Topics Concern  . Not on file  Social History Narrative  . Not on file   Social Determinants of Health   Financial Resource Strain:   . Difficulty of Paying Living Expenses:   Food Insecurity:   . Worried About Charity fundraiser in the Last Year:   . Arboriculturist in the Last Year:   Transportation Needs:   . Film/video editor (Medical):   Marland Kitchen Lack of Transportation (Non-Medical):   Physical Activity:   . Days of Exercise per Week:   . Minutes of Exercise per Session:   Stress:   . Feeling of Stress :   Social Connections:   . Frequency of Communication with Friends and Family:   . Frequency of Social Gatherings with Friends and Family:   . Attends Religious Services:   . Active Member of Clubs or Organizations:   . Attends Archivist Meetings:   Marland Kitchen Marital Status:     Family History  Problem Relation Age of Onset  . Pancreatic cancer Brother 7       dx'd 5 months before death  . Dementia Mother     Past Medical History:  Diagnosis Date  . Adhesive capsulitis of right shoulder 06/19/2019  . BCC (basal cell carcinoma)    face  . Carpal tunnel syndrome    right wrist  . Kidney stones   . Osteoarthritis    back, knees, neck  . Recurrent upper respiratory infection (URI)    gets colds infrequently...  . Tinea versicolor     Past Surgical History:  Procedure Laterality Date  . COLONOSCOPY    . LUMBAR LAMINECTOMY/DECOMPRESSION MICRODISCECTOMY  10/03/2011   Procedure: LUMBAR LAMINECTOMY/DECOMPRESSION MICRODISCECTOMY;  Surgeon: Hosie Spangle;  Location: [facility];  Service: Neurosurgery;  Laterality: Left;  LEFT Lumbar four-five  EXTRAFORAMINAL MICRODISKECTOMY  . MOHS SURGERY  01/2014  . right ctr  01/23/2019  . TONSILLECTOMY    . VASECTOMY       Current Outpatient Medications on File Prior to Visit  Medication Sig Dispense Refill  . Cholecalciferol (VITAMIN D3 PO) Take by mouth.    . Cyanocobalamin (B-12 PO) Take by mouth.    . donepezil (ARICEPT) 5 MG tablet Take 1 tablet (5 mg total) by mouth at bedtime. 30 tablet 2  . simvastatin (ZOCOR) 20  MG tablet Take 20 mg by mouth daily.       No current facility-administered medications on file prior to visit.    No Known Allergies  Physical exam:  Today's Vitals   04/20/20 1323  BP: 103/61  Pulse: (!) 59  Weight: 130 lb (59 kg)  Height: 5\' 8"  (1.727 m)   Body mass index is 19.77 kg/m.   Wt Readings from Last 3 Encounters:  04/20/20 130 lb (59 kg)  12/10/19 134 lb (60.8 kg)  10/29/19 133 lb 8 oz (60.6 kg)     Ht Readings from Last 3 Encounters:  04/20/20 5\' 8"  (1.727 m)  12/10/19 5\' 8"  (1.727 m)  10/29/19 5\' 8"  (1.727 m)      General: The patient is awake, alert and appears not in acute distress. The patient is well groomed. He is tanned.  Head: Normocephalic, atraumatic. Neck is supple. Mallampati 1- wide open with retrognathia.,  neck circumference 15.5  inches . Nasal airflow patent.  Dental status: intact.   Cardiovascular:  Regular rate and cardiac rhythm by pulse,  without distended neck veins. Respiratory: Lungs are clear to auscultation.  Skin:  Without evidence of ankle edema, or rash. Trunk: The patient's posture is erect.    Neurologic exam : The patient is awake and alert, oriented to place and time.   Memory subjective described -  defensiveness-  Attention span & concentration ability appears abnormal, he is angry - about the test, about the test results not being here. Speech is fluent,  without  dysarthria, dysphonia or aphasia.  Mood and affect are slightly agitated.   Montreal Cognitive Assessment  04/20/2020 12/10/2019    Visuospatial/ Executive (0/5) 3 5  Naming (0/3) 2 3  Attention: Read list of digits (0/2) 1 0  Attention: Read list of letters (0/1) 1 1  Attention: Serial 7 subtraction starting at 100 (0/3) 3 3  Language: Repeat phrase (0/2) 0 1  Language : Fluency (0/1) 0 1  Abstraction (0/2) 2 2  Delayed Recall (0/5) 2 1  Orientation (0/6) 6 6  Total 20 23      Cranial nerves: no loss of smell or taste reported  Pupils are equal and briskly reactive to light. Funduscopic exam intact.  Extraocular movements in vertical and horizontal planes were intact and without nystagmus. No Diplopia. Visual fields by finger perimetry are intact. Hearing was intact to soft voice and finger rubbing.    Facial sensation intact to fine touch.  Facial motor strength is symmetric and tongue and uvula move midline.  Neck ROM : rotation, tilt and flexion extension were normal for age and shoulder shrug was symmetrical.    Motor exam:  Symmetric bulk, tone and ROM.  He has problems with dexterity, handwriting , drawing.  Waxy increased tone without cog wheeling, symmetric grip strength- reduced in both hands.  Sensory:  Fine touch, pinprick and vibration were tested  and normal.  Proprioception tested in the upper extremities was normal.   Coordination: Rapid alternating movements in the fingers/hands were slowed The Finger-to-nose maneuver was intact without evidence of ataxia, dysmetria or tremor.   Gait and station: Patient could rise unassisted from a seated position, walked without assistive device. Increased width, normal arm swing, slightly stooped.  Stance is of normal width/ base and the patient turned with 4 steps.  Toe and heel walk were intact. Deep tendon reflexes: in the upper and lower extremities are symmetric and very brisk- crossed reflex reaction . Babinski response  was deferred.     There is dementia with behavioural changes.  The patient had an episode when he misplaced a gun while home  repairs were made, and then forgot and thought the gun was stolen. He had placed in the attic, and was repeatedly denying he placed it there, He remembered 5  Days later.   After spending a total time of  20  minutes face to face and additional time for physical and neurologic examination, review of laboratory studies,  personal review of imaging studies and discussion with the family in person and on the phone (daughter Alexander Cummings) , reports and results of other testing and review of referral information / records as far as provided in visit, I have established the following assessments:  We have no results form his  referral to neuropsychology testing .  Mental status / memory- short term memory loss. He had failed 4-5 years ago a memory test for long term care insurance.   He has progressed over the last 6 month , according to his daughter, Alexander Klinefelter, RN.  I would like to thank Shirline Frees, MD and Dr. Apolonio Schneiders, MD,  for allowing me to meet with and to take care of this pleasant patient.  In short, ELYON YOUNGKIN is presenting with cognitive dysfunction, a symptom that can be attributed to dysfunction and atrophy in left brain hemisphere.  He has dementia - and progression.  I plan to follow up either personally if the neuro psychological results are made available- I do not think that Mr. Kaller has insight in his condition and will likely overestimate his ability, and underestimate his cognitive impairment.  He speaks in incomplete sentences. He is to be referred to the memory clinic at [facility].      CC: I will share my notes with PCP/ orthopedist   Larey Seat, MD      Addendum  From 11-04-2019- Dear Dr. Kenton Kingfisher and Dr. Apolonio Schneiders,  I was finally able to review the MRI from 15 October 2019.  This is a patient who has a history of right carpal tunnel surgery, fell from a ladder approximately 30 months ago which resulted in loss of sense of smell after head injury.  He  continued to have a right upper extremity weakness numbness in the ulnar fingers and complains about dropping objects.  His cervical MRI was performed on 15 October 2019.  The interspace between C1 and 2 at the craniocervical junction was unremarkable as was C2-C3.  There is already moderate spinal canal stenosis noted with flattening of the cord between the third and fourth cervical vertebra and severe left uncovertebral osteophytosis with severe left-sided facet arthrosis.  The neural exit foramina are narrow more severe on the left than on the right.  This continues to C3-C5 with moderate bulging, a narrowed canal, with moderate severe left-sided facet arthrosis moderate spinal canal stenosis severe left uncovertebral osteophytosis and neural foraminal narrowing.  C5 and C6 have the same findings moderate severe left uncovertebral osteophytosis at this time severe left facet arthrosis but no significant spinal stenosis severe left-sided foraminal from foraminal narrowing here the findings on the right and milder than on the left.  There was no spinal canal stenosis noted between the sixth and seventh vertebra. These findings of partially severe foraminal cervical stenosis and spinal stenosis could explain left-sided pain syndrome, hyperreflexia, left extremity weakness or dysesthesias. MRI brain of the same date without contrast showed asymmetric left parietal atrophy with sulci widening accordingly  in distribution left over right.      Electronically signed by: Larey Seat, MD 04/20/2020 1:58 PM  [facility] Board certified by The [employer] of Sleep Medicine and Diplomate of the [employer] of Sleep Medicine. Board certified In Neurology through the [place], Fellow of the [employer] of Neurology. Medical Director of [facility].

## 2020-04-20 NOTE — Patient Instructions (Signed)
Neuropsychological Examination A neuropsychological examination is a series of standardized tests that measure a person's knowledge, thinking, language, behaviors, and memory. The exam may be done with people who have had a brain injury or trauma, or who have a disease or condition that affects the brain. The results will help your health care provider diagnose any problems and create a treatment or rehabilitation plan. There are several areas, or modules, to the exam. A trained examiner will administer the exam and explain how to complete each module. Tell a health care provider about:  All medicines you are taking, including vitamins, herbs, eye drops, creams, and over-the-counter medicines.  Any medical conditions you have, including recent injuries.  Any other concerns or behaviors that might affect your ability to take the exam.  Any surgeries you have had. What happens before the procedure?   Ask your health care provider about: ? Changing or stopping your regular medicines. This is especially important if you are taking any medicines that affect your memory or ability to concentrate. ? Taking over-the-counter medicines, vitamins, herbs, and supplements. ? Bringing a list of current medicines to the exam, and whether any of the medicines will affect exam results.  Ask a family member or friend to come to the test if you have trouble remembering family history or any other details of your condition.  Prepare any records of previous neurological testing, including CT or MRI scans, to bring to the exam.  Get a good night's rest before the exam.  Eat a nutritious meal prior to the exam.  Do not drink alcohol for 24 hours before the exam.  Let the examiner know about any other concerns or behaviors that might affect your ability to take the exam. What happens during the procedure?  You will arrive at an outpatient clinic or a hospital.  You will sit at a table for most of the  exam.  You will answer questions, make choices, read and write, and complete simple tasks. You might also use a computer-guided lesson.  Your examiner will give you the tests that are most closely related to your disease or symptoms. The exam may take several hours, depending on the areas being tested.  You will be given breaks when needed. The exam may be split into more than one session. The procedure may vary among health care providers and hospitals. What happens after the procedure? Ask your health care provider, or the department that is doing the test:  When will my results be ready?  How will I get my results?  What are my treatment options?  What other tests do I need?  What are my next steps? You may go home after the exam. Summary  A neuropsychological examination is a series of standardized tests that measure a person's knowledge, thinking, language, behaviors, and memory. The exam may be done with people who have had a brain injury or trauma, or who have a disease or condition that affects the brain.  The results will help your health care provider diagnose any problems and create a treatment or rehabilitation plan.  Before the test, be sure to talk to your health care provider about any medicines you are taking, especially medicines that may affect your memory or ability to concentrate.  Talk with your health care provider about what your results mean. This information is not intended to replace advice given to you by your health care provider. Make sure you discuss any questions you have with your health care  provider. Document Revised: 11/30/2017 Document Reviewed: 11/30/2017 Elsevier Patient Education  [place].

## 2020-04-22 ENCOUNTER — Telehealth: Payer: Self-pay | Admitting: Neurology

## 2020-04-22 MED ORDER — DONEPEZIL HCL 5 MG PO TABS: 5.0000 mg | ORAL_TABLET | Freq: Every day | ORAL | 2 refills | Status: DC

## 2020-04-22 NOTE — Telephone Encounter (Signed)
I contacted the pt. He sts the neuropsych report should be sent over soon. The physician is finalizing the report.  Refill for Aricept has been refilled.

## 2020-04-22 NOTE — Telephone Encounter (Signed)
Pt's wife called stating that a report from the neurosurgeon is being prepared on the pt. She also states the pt is needing a refill on his donepezil (ARICEPT) 5 MG tablet and have it sent to the [facility] on [address]. Please advise.

## 2020-04-29 NOTE — Telephone Encounter (Signed)
I reached out to the pt and advised we have received the finalized report for the neuropsych eval. Report will be held to appointment. Copy of result placed to be scanned.  Pt scheduled for 05/20/2020 at 130 pm.

## 2020-04-29 NOTE — Telephone Encounter (Signed)
Pt is asking for a call from RN re: an appointment to discuss his neuropsych report, he wants to discuss when Dr Dohmeier wants to do this.  Please call

## 2020-04-30 ENCOUNTER — Telehealth: Payer: Self-pay | Admitting: Neurology

## 2020-04-30 DIAGNOSIS — R278 Other lack of coordination: Secondary | ICD-10-CM

## 2020-04-30 DIAGNOSIS — F0281 Dementia in other diseases classified elsewhere with behavioral disturbance: Secondary | ICD-10-CM

## 2020-04-30 DIAGNOSIS — R4701 Aphasia: Secondary | ICD-10-CM

## 2020-04-30 DIAGNOSIS — R292 Abnormal reflex: Secondary | ICD-10-CM

## 2020-04-30 NOTE — Telephone Encounter (Signed)
Dr. Brett Fairy Dr. Rita Ohara has retired . [facility] is requesting that you put in referral what patient needs to be seen for . Thanks Hinton Dyer

## 2020-05-05 NOTE — Telephone Encounter (Signed)
Patient had been referred by Dr Apolonio Schneiders, orthopedic surgeon. I will defer to his PCP for any referral for joint pain or non neurological weakness and ask to keep ortho in the loop.  My role became to steer the dementia / cognitive work up. We have the results from Dr Rica Mote now-   Dementia is present.  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   No need to involve Dr Sherwood Gambler unless the patient wanted to be seen for sciatica or back pain.

## 2020-05-06 NOTE — Telephone Encounter (Signed)
I called [facility] with update . And patient is aware also . referencing no below .

## 2020-05-11 ENCOUNTER — Telehealth: Payer: Self-pay | Admitting: Adult Health

## 2020-05-11 NOTE — Telephone Encounter (Signed)
Patient called LVM requesting a call back regarding referral to neurosurgery.   I am helping with VM only.

## 2020-05-11 NOTE — Telephone Encounter (Signed)
Pt called back stating that Dr. Sherwood Gambler has retired and he has no one else that he can think of going to so he would like to know who the provider would suggest. Please advise.

## 2020-05-11 NOTE — Telephone Encounter (Signed)
I called pt to discuss. No answer, left a message asking him to call me back. Please assist pt with his referral question. His referral was sent to Dr. Sherwood Gambler on 04/22/2020.

## 2020-05-13 ENCOUNTER — Encounter: Payer: Self-pay | Admitting: Neurology

## 2020-05-13 NOTE — Telephone Encounter (Signed)
Everybody in that group is excellent, Drs Christella Noa, Marina Goodell. I feel strongly he can be seen by any of them and be happy.

## 2020-05-14 ENCOUNTER — Other Ambulatory Visit: Payer: Self-pay | Admitting: Neurology

## 2020-05-14 NOTE — Telephone Encounter (Signed)
Spoke with the patient because there is a lot of confusing documentation on why a referral to neurosurgery was placed at the time of the last office visit.  Pt states that over year and half- 2 yrs ago he had carpal tunnel surgery. States that what he has continued to struggle with since being seen here as a patient is decreased strength in the hand and sensation loss. Describes pain in neck and arm.  Per last phone note early June Dr Dohmeier mentioned that neurosurgery referral was not needed unless pt had c/o of sciatica of back pain. Pt mentions some neck discomfort but mostly the concern is related to the hand. Patient doesn't care if it is orthro he needs to see or neurosurgery he just would like to address this constant pain and discomfort that he has continued to struggle with over a yr. Informed the patient that I would discuss with Dr Brett Fairy and bring to her attention so that she can decide where the patient would truly be served. Patient advised that he has upcoming apt on next week and can wait and discuss further at that time so that everyone is on the same page. Advised that would be fine too but in meantime in case she wanted to place a referral we can possibly get the ball rolling sooner otherwise she may wait to discuss at the apt. He verbalized understanding.

## 2020-05-14 NOTE — Telephone Encounter (Signed)
Alexander Cummings Has talked to patient and is helping him . Hold referral for Now . Patient is  aware.

## 2020-05-14 NOTE — Telephone Encounter (Signed)
Noted I will sent referral 05/14/2020 to Dr. Ellene Route Thanks Hinton Dyer.

## 2020-05-14 NOTE — Telephone Encounter (Signed)
Neurosurgery needs to have on referral what they are seeing patient for please advise Can you re-order . Neurosurgery called on this .

## 2020-05-18 ENCOUNTER — Ambulatory Visit: Payer: PPO | Admitting: Neurology

## 2020-05-18 ENCOUNTER — Other Ambulatory Visit: Payer: Self-pay

## 2020-05-18 ENCOUNTER — Encounter: Payer: Self-pay | Admitting: Neurology

## 2020-05-18 VITALS — BP 121/66 | HR 51 | Ht 68.0 in | Wt 130.0 lb

## 2020-05-18 DIAGNOSIS — R292 Abnormal reflex: Secondary | ICD-10-CM | POA: Diagnosis not present

## 2020-05-18 DIAGNOSIS — G309 Alzheimer's disease, unspecified: Secondary | ICD-10-CM | POA: Diagnosis not present

## 2020-05-18 DIAGNOSIS — R4701 Aphasia: Secondary | ICD-10-CM

## 2020-05-18 DIAGNOSIS — F0281 Dementia in other diseases classified elsewhere with behavioral disturbance: Secondary | ICD-10-CM | POA: Diagnosis not present

## 2020-05-18 DIAGNOSIS — R278 Other lack of coordination: Secondary | ICD-10-CM | POA: Diagnosis not present

## 2020-05-18 DIAGNOSIS — R9089 Other abnormal findings on diagnostic imaging of central nervous system: Secondary | ICD-10-CM | POA: Diagnosis not present

## 2020-05-18 DIAGNOSIS — R482 Apraxia: Secondary | ICD-10-CM | POA: Diagnosis not present

## 2020-05-18 DIAGNOSIS — G3184 Mild cognitive impairment, so stated: Secondary | ICD-10-CM | POA: Diagnosis not present

## 2020-05-18 MED ORDER — DONEPEZIL HCL 10 MG PO TABS: 10.0000 mg | ORAL_TABLET | Freq: Every day | ORAL | 6 refills | Status: DC

## 2020-05-18 NOTE — Addendum Note (Signed)
Addended by: Larey Seat on: 05/18/2020 02:14 PM   Modules accepted: Orders

## 2020-05-18 NOTE — Progress Notes (Signed)
Provider:  Larey Seat, MD  Primary Care Physician:  Shirline Frees, MD [place] [address]     Referring Provider: Dr. Apolonio Schneiders, MD          Chief Complaint according to patient   Patient presents with:    . New Patient (Initial Visit)           HISTORY OF PRESENT ILLNESS:  Alexander Cummings is a 74 y.o. year old White or Caucasian male patient seen on 05-18-2020.  patient here to talk about the  neuropsychological testing battery results, indicating Dx of   Alzheimer dementia. I see quite substantial cortical atrophy and plump gyri.   I like to increase the Alzheimer's medication- to 10 mg, and have him take a multivitamin and have OT.       04-20-2020, after neuropsychological testing . They had the neuro psych completed with Dr. Ane Payment, but have not heard about results yet.  We did not get results of these testing batteries. Carpal tunnel ? He has seen a Dr. Roberts Gaudy , MD, PhD,  orthopedist at [facility] - who wanted yet another NCV and EMG for this patient.  DR. Ramonita Lab had referred him.      12/10/2019 in a RV.  Wife is here in person, daughter is on the phone. The patient is defensive.  His MOCA was 23/ 30 , his handwriting is changed, his word output is reduced.  His MRI brain showed left sided atrophy, which involves the word-finding part. He replaces some words with similar words, such as body temperature with pulse,   We are now repeating an NCV and EMG to help differentiate his dominant hand/ arm dysfunction from neck injury/ DDD or from CNS changes. Also referral to neuropsychology testing . Mental status / memory- short term memory loss. He had failed 4-5 years ago a memory test for long term care insurance.  He has progressed over the last 6 month , according to his RN daughter, [name].     10-29-2019 He was seen here as an URGENT  referral on  from Dr Apolonio Schneiders* for a neck-spine and abnormal  brain MRI evaluation, while the family is concerned about a memory loss.  Chief concern according to patient : abnormal MRI of cervical spine is not families main concern.    I have the pleasure of seeing Alexander Cummings today, a right -handed White or Caucasian male with a Master's degree in accounting- MBA.  He  has a past medical history of Adhesive capsulitis of right shoulder (06/19/2019), BCC (basal cell carcinoma), Carpal tunnel syndrome, Kidney stones, Osteoarthritis, Recurrent upper respiratory infection (URI), and Tinea versicolor.   Family noted symptoms about a year ago, progressing over the last 9 month [name]- RN and daughter). Wife noted decrease in weight over 24 month, losing 20 pounds. Noted word finding problems, stumbling and stiffness, clumsiness, and cognitive impairment. "playing charades' at home trying to finish his sentences. Apraxia with common tasks  like how to use a credit card reader.   Changing of lanes when driving- wife bought a lane alarm.  Forgets turn signal.  Anosmia and ageusia after a fall from a ladder- Feb 20, 2016 MRI is in EPIC- no abnormalities.  New MRI images are not available- speaks of vast changes. .   Family medical  history: Mother died at age 20, late onset dementia. Father died at 62 Nov.1st 02-19-18.  Social history: Patient  retired from [employer] and lives in a household with 2 persons. Family status is married , with adult children, grandchildren. One dog.   Tobacco use; never.  ETOH use : one beer a week. Caffeine intake in form of Coffee( 2 cups ) Soda( rare) Tea ( rare ) or energy drinks. Regular exercise in form of tennis, golf.  Walking the dog.  Hobbies : tennis.      Review of Systems: Out of a complete 14 system review, the patient complains of only the following symptoms, and all other reviewed systems are negative.: MMSE 28-30 here and 26-30 at Dr. Kenton Kingfisher.   Dr. Sherwood Gambler did Surgery 2012 lumbar spine. NCV and EMG with Dr  Nelva Bush. Delayed nerve conduction medial nerve right side, which is expected post cap rpal tunnel op.      Social History   Socioeconomic History  . Marital status: Married    Spouse name: Not on file  . Number of children: Not on file  . Years of education: Not on file  . Highest education level: Not on file  Occupational History  . Not on file  Tobacco Use  . Smoking status: Never Smoker  . Smokeless tobacco: Never Used  Substance and Sexual Activity  . Alcohol use: Yes    Alcohol/week: 1.0 standard drink    Types: 1 Cans of beer per week  . Drug use: No  . Sexual activity: Yes  Other Topics Concern  . Not on file  Social History Narrative  . Not on file   Social Determinants of Health   Financial Resource Strain:   . Difficulty of Paying Living Expenses:   Food Insecurity:   . Worried About Charity fundraiser in the Last Year:   . Arboriculturist in the Last Year:   Transportation Needs:   . Film/video editor (Medical):   Marland Kitchen Lack of Transportation (Non-Medical):   Physical Activity:   . Days of Exercise per Week:   . Minutes of Exercise per Session:   Stress:   . Feeling of Stress :   Social Connections:   . Frequency of Communication with Friends and Family:   . Frequency of Social Gatherings with Friends and Family:   . Attends Religious Services:   . Active Member of Clubs or Organizations:   . Attends Archivist Meetings:   Marland Kitchen Marital Status:     Family History  Problem Relation Age of Onset  . Pancreatic cancer Brother 18       dx'd 5 months before death  . Dementia Mother     Past Medical History:  Diagnosis Date  . Adhesive capsulitis of right shoulder 06/19/2019  . BCC (basal cell carcinoma)    face  . Carpal tunnel syndrome    right wrist  . Kidney stones   . Osteoarthritis    back, knees, neck  . Recurrent upper respiratory infection (URI)    gets colds infrequently...  . Tinea versicolor     Past Surgical History:    Procedure Laterality Date  . COLONOSCOPY    . LUMBAR LAMINECTOMY/DECOMPRESSION MICRODISCECTOMY  10/03/2011   Procedure: LUMBAR LAMINECTOMY/DECOMPRESSION MICRODISCECTOMY;  Surgeon: Hosie Spangle;  Location: [facility];  Service: Neurosurgery;  Laterality: Left;  LEFT Lumbar four-five EXTRAFORAMINAL MICRODISKECTOMY  . MOHS SURGERY  01/2014  . right ctr  01/23/2019  . TONSILLECTOMY    . VASECTOMY       Current Outpatient Medications on  File Prior to Visit  Medication Sig Dispense Refill  . donepezil (ARICEPT) 5 MG tablet Take 1 tablet (5 mg total) by mouth at bedtime. 30 tablet 2  . simvastatin (ZOCOR) 20 MG tablet Take 20 mg by mouth daily.       No current facility-administered medications on file prior to visit.    No Known Allergies  Physical exam:  Today's Vitals   05/18/20 1324  BP: 121/66  Pulse: (!) 51  Weight: 130 lb (59 kg)  Height: 5\' 8"  (1.727 m)   Body mass index is 19.77 kg/m.   Wt Readings from Last 3 Encounters:  05/18/20 130 lb (59 kg)  04/20/20 130 lb (59 kg)  12/10/19 134 lb (60.8 kg)     Ht Readings from Last 3 Encounters:  05/18/20 5\' 8"  (1.727 m)  04/20/20 5\' 8"  (1.727 m)  12/10/19 5\' 8"  (1.727 m)      General: The patient is awake, alert and appears not in acute distress. The patient is well groomed. He is tanned.  Head: Normocephalic, atraumatic. Neck is supple. Mallampati 1- wide open with retrognathia.,  neck circumference 15.5  inches . Nasal airflow patent.  Dental status: intact.   Cardiovascular:  Regular rate and cardiac rhythm by pulse,  without distended neck veins. Respiratory: Lungs are clear to auscultation.  Skin:  Without evidence of ankle edema, or rash. Trunk: The patient's posture is erect.    Neurologic exam : The patient is awake and alert, oriented to place and time.   Memory subjective described -  defensiveness-  Attention span & concentration ability appears abnormal, he is angry - about the test, about the  test results not being here. Speech is fluent,  without  dysarthria, dysphonia or aphasia.  Mood and affect are slightly agitated.   Montreal Cognitive Assessment  04/20/2020 12/10/2019  Visuospatial/ Executive (0/5) 3 5  Naming (0/3) 2 3  Attention: Read list of digits (0/2) 1 0  Attention: Read list of letters (0/1) 1 1  Attention: Serial 7 subtraction starting at 100 (0/3) 3 3  Language: Repeat phrase (0/2) 0 1  Language : Fluency (0/1) 0 1  Abstraction (0/2) 2 2  Delayed Recall (0/5) 2 1  Orientation (0/6) 6 6  Total 20 23      Cranial nerves: no loss of smell or taste reported  Pupils are equal and briskly reactive to light. Funduscopic exam intact.  Extraocular movements in vertical and horizontal planes were intact and without nystagmus. No Diplopia. Visual fields by finger perimetry are intact. Hearing was intact to soft voice and finger rubbing.    Facial sensation intact to fine touch.  Facial motor strength is symmetric and tongue and uvula move midline.  Neck ROM : rotation, tilt and flexion extension were normal for age and shoulder shrug was symmetrical.    Motor exam:  Symmetric bulk, tone and ROM.  He has problems with dexterity, handwriting , drawing.  Waxy increased tone without cog wheeling, symmetric grip strength- reduced in both hands.  Sensory:  Fine touch, pinprick and vibration were tested  and normal.  Proprioception tested in the upper extremities was normal.   Coordination: Rapid alternating movements in the fingers/hands were slowed The Finger-to-nose maneuver was intact without evidence of ataxia, dysmetria or tremor.   Gait and station: Patient could rise unassisted from a seated position, walked without assistive device. Increased width, normal arm swing, slightly stooped.  Stance is of normal width/ base and the patient  turned with 4 steps.  Toe and heel walk were intact. Deep tendon reflexes: in the upper and lower extremities are symmetric and  very brisk- crossed reflex reaction . Babinski response was deferred.     There is dementia with behavioural changes.  The patient had an episode when he misplaced a gun while home repairs were made, and then forgot and thought the gun was stolen. He had placed in the attic, and was repeatedly denying he placed it there, He remembered 5  Days later.   After spending a total time of  20  minutes face to face and additional time for physical and neurologic examination, review of laboratory studies,  personal review of imaging studies and discussion with the family in person and on the phone (daughter Colletta Maryland) , reports and results of other testing and review of referral information / records as far as provided in visit, I have established the following assessments:  We have no results form his  referral to neuropsychology testing .  Mental status / memory- short term memory loss. He had failed 4-5 years ago a memory test for long term care insurance.   He has progressed over the last 6 month , according to his daughter, [name], RN.  I would like to thank Shirline Frees, MD and Dr. Apolonio Schneiders, MD,  for allowing me to meet with and to take care of this pleasant patient.  In short, Alexander Cummings is presenting with cognitive dysfunction, a symptom that can be attributed to dysfunction and atrophy in left brain hemisphere.  He has dementia - and progression.  I plan to follow up either personally if the neuro psychological results are made available- I do not think that Mr. Germano has insight in his condition and will likely overestimate his ability, and underestimate his cognitive impairment.  He speaks in incomplete sentences. He is to be referred to the memory clinic at [facility].      CC: I will share my notes with PCP/ orthopedist   Larey Seat, MD      Addendum from 11-04-2019- Dear Dr. Kenton Kingfisher and Dr. Apolonio Schneiders,  I was finally able to review the MRI from 15 October 2019.   This is a patient who has a history of right carpal tunnel surgery, fell from a ladder approximately 30 months ago which resulted in loss of sense of smell after head injury.  He continued to have a right upper extremity weakness numbness in the ulnar fingers and complains about dropping objects.  His cervical MRI was performed on 15 October 2019.  The interspace between C1 and 2 at the craniocervical junction was unremarkable as was C2-C3.  There is already moderate spinal canal stenosis noted with flattening of the cord between the third and fourth cervical vertebra and severe left uncovertebral osteophytosis with severe left-sided facet arthrosis.  The neural exit foramina are narrow more severe on the left than on the right.  This continues to C3-C5 with moderate bulging, a narrowed canal, with moderate severe left-sided facet arthrosis moderate spinal canal stenosis severe left uncovertebral osteophytosis and neural foraminal narrowing.  C5 and C6 have the same findings moderate severe left uncovertebral osteophytosis at this time severe left facet arthrosis but no significant spinal stenosis severe left-sided foraminal from foraminal narrowing here the findings on the right and milder than on the left.  There was no spinal canal stenosis noted between the sixth and seventh vertebra. These findings of partially severe foraminal cervical stenosis and  spinal stenosis could explain left-sided pain syndrome, hyperreflexia, left extremity weakness or dysesthesias. MRI brain of the same date without contrast showed asymmetric left parietal atrophy with sulci widening accordingly in distribution left over right.   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  There was some verbal trouble - but also right hand and foot clumsiness. He has burning sensation and sometimes an involuntary movement of the hand.  He does have right exit foraminal stenosis in Cervical neck ,  seen on MRI- but he presents more with apraxia.  There was no spinal canal stenosis noted between the sixth and seventh vertebra.  I increased Aricept  To 10 mg from 5 mg -  He takes a bite of banana with it .     Electronically signed by: Larey Seat, MD 05/18/2020 1:41 PM  [facility] Board certified by The [employer] of Sleep Medicine and Diplomate of the [employer] of Sleep Medicine. Board certified In Neurology through the [employer], Fellow of the [employer] of Neurology. Medical Director of [facility].

## 2020-05-18 NOTE — Patient Instructions (Signed)

## 2020-05-20 ENCOUNTER — Ambulatory Visit: Payer: Self-pay | Admitting: Neurology

## 2020-05-28 DIAGNOSIS — M5412 Radiculopathy, cervical region: Secondary | ICD-10-CM | POA: Diagnosis not present

## 2020-05-28 DIAGNOSIS — E7800 Pure hypercholesterolemia, unspecified: Secondary | ICD-10-CM | POA: Diagnosis not present

## 2020-05-28 DIAGNOSIS — R1031 Right lower quadrant pain: Secondary | ICD-10-CM | POA: Diagnosis not present

## 2020-05-28 DIAGNOSIS — R413 Other amnesia: Secondary | ICD-10-CM | POA: Diagnosis not present

## 2020-05-28 DIAGNOSIS — R630 Anorexia: Secondary | ICD-10-CM | POA: Diagnosis not present

## 2020-07-24 DIAGNOSIS — Z681 Body mass index (BMI) 19 or less, adult: Secondary | ICD-10-CM | POA: Diagnosis not present

## 2020-07-24 DIAGNOSIS — R278 Other lack of coordination: Secondary | ICD-10-CM | POA: Diagnosis not present

## 2020-07-24 DIAGNOSIS — R030 Elevated blood-pressure reading, without diagnosis of hypertension: Secondary | ICD-10-CM | POA: Diagnosis not present

## 2020-07-24 DIAGNOSIS — M542 Cervicalgia: Secondary | ICD-10-CM | POA: Diagnosis not present

## 2020-08-04 DIAGNOSIS — M542 Cervicalgia: Secondary | ICD-10-CM | POA: Diagnosis not present

## 2020-08-04 DIAGNOSIS — M4802 Spinal stenosis, cervical region: Secondary | ICD-10-CM | POA: Diagnosis not present

## 2020-08-04 DIAGNOSIS — M47812 Spondylosis without myelopathy or radiculopathy, cervical region: Secondary | ICD-10-CM | POA: Diagnosis not present

## 2020-08-12 DIAGNOSIS — M542 Cervicalgia: Secondary | ICD-10-CM | POA: Diagnosis not present

## 2020-08-27 DIAGNOSIS — M1990 Unspecified osteoarthritis, unspecified site: Secondary | ICD-10-CM | POA: Diagnosis not present

## 2020-08-27 DIAGNOSIS — M19042 Primary osteoarthritis, left hand: Secondary | ICD-10-CM | POA: Diagnosis not present

## 2020-08-27 DIAGNOSIS — M19041 Primary osteoarthritis, right hand: Secondary | ICD-10-CM | POA: Diagnosis not present

## 2020-08-27 DIAGNOSIS — E7800 Pure hypercholesterolemia, unspecified: Secondary | ICD-10-CM | POA: Diagnosis not present

## 2020-08-27 DIAGNOSIS — N401 Enlarged prostate with lower urinary tract symptoms: Secondary | ICD-10-CM | POA: Diagnosis not present

## 2020-09-03 DIAGNOSIS — Z23 Encounter for immunization: Secondary | ICD-10-CM | POA: Diagnosis not present

## 2020-09-03 DIAGNOSIS — Z125 Encounter for screening for malignant neoplasm of prostate: Secondary | ICD-10-CM | POA: Diagnosis not present

## 2020-09-03 DIAGNOSIS — R634 Abnormal weight loss: Secondary | ICD-10-CM | POA: Diagnosis not present

## 2020-09-03 DIAGNOSIS — M4802 Spinal stenosis, cervical region: Secondary | ICD-10-CM | POA: Diagnosis not present

## 2020-09-03 DIAGNOSIS — R413 Other amnesia: Secondary | ICD-10-CM | POA: Diagnosis not present

## 2020-09-03 DIAGNOSIS — E7800 Pure hypercholesterolemia, unspecified: Secondary | ICD-10-CM | POA: Diagnosis not present

## 2020-09-22 ENCOUNTER — Ambulatory Visit
Admission: RE | Admit: 2020-09-22 | Discharge: 2020-09-22 | Disposition: A | Discharge status: Home / Self Care | Payer: PPO | Source: Ambulatory Visit | Attending: Family Medicine | Admitting: Family Medicine

## 2020-09-22 ENCOUNTER — Other Ambulatory Visit: Payer: Self-pay | Admitting: Family Medicine

## 2020-09-22 ENCOUNTER — Other Ambulatory Visit: Payer: Self-pay

## 2020-09-22 DIAGNOSIS — R634 Abnormal weight loss: Secondary | ICD-10-CM

## 2020-09-25 DIAGNOSIS — Z85828 Personal history of other malignant neoplasm of skin: Secondary | ICD-10-CM | POA: Diagnosis not present

## 2020-09-25 DIAGNOSIS — B360 Pityriasis versicolor: Secondary | ICD-10-CM | POA: Diagnosis not present

## 2020-09-25 DIAGNOSIS — D2271 Melanocytic nevi of right lower limb, including hip: Secondary | ICD-10-CM | POA: Diagnosis not present

## 2020-09-25 DIAGNOSIS — L578 Other skin changes due to chronic exposure to nonionizing radiation: Secondary | ICD-10-CM | POA: Diagnosis not present

## 2020-09-25 DIAGNOSIS — L821 Other seborrheic keratosis: Secondary | ICD-10-CM | POA: Diagnosis not present

## 2020-09-25 DIAGNOSIS — B078 Other viral warts: Secondary | ICD-10-CM | POA: Diagnosis not present

## 2020-09-25 DIAGNOSIS — L570 Actinic keratosis: Secondary | ICD-10-CM | POA: Diagnosis not present

## 2020-09-25 DIAGNOSIS — Z85820 Personal history of malignant melanoma of skin: Secondary | ICD-10-CM | POA: Diagnosis not present

## 2020-09-25 DIAGNOSIS — D225 Melanocytic nevi of trunk: Secondary | ICD-10-CM | POA: Diagnosis not present

## 2020-09-25 DIAGNOSIS — B079 Viral wart, unspecified: Secondary | ICD-10-CM | POA: Diagnosis not present

## 2020-10-07 DIAGNOSIS — E7800 Pure hypercholesterolemia, unspecified: Secondary | ICD-10-CM | POA: Diagnosis not present

## 2020-10-07 DIAGNOSIS — R630 Anorexia: Secondary | ICD-10-CM | POA: Diagnosis not present

## 2020-10-07 DIAGNOSIS — Z0000 Encounter for general adult medical examination without abnormal findings: Secondary | ICD-10-CM | POA: Diagnosis not present

## 2020-10-07 DIAGNOSIS — R413 Other amnesia: Secondary | ICD-10-CM | POA: Diagnosis not present

## 2020-10-07 DIAGNOSIS — M4802 Spinal stenosis, cervical region: Secondary | ICD-10-CM | POA: Diagnosis not present

## 2020-10-07 DIAGNOSIS — N3281 Overactive bladder: Secondary | ICD-10-CM | POA: Diagnosis not present

## 2020-11-30 DIAGNOSIS — Z1152 Encounter for screening for COVID-19: Secondary | ICD-10-CM | POA: Diagnosis not present

## 2020-11-30 DIAGNOSIS — U071 COVID-19: Secondary | ICD-10-CM | POA: Diagnosis not present

## 2020-12-08 ENCOUNTER — Other Ambulatory Visit: Payer: Self-pay

## 2020-12-08 ENCOUNTER — Encounter: Payer: Self-pay | Admitting: Adult Health

## 2020-12-08 ENCOUNTER — Ambulatory Visit: Payer: PPO | Admitting: Adult Health

## 2020-12-08 VITALS — BP 131/81 | HR 87 | Ht 68.0 in | Wt 130.0 lb

## 2020-12-08 DIAGNOSIS — F0281 Dementia in other diseases classified elsewhere with behavioral disturbance: Secondary | ICD-10-CM | POA: Diagnosis not present

## 2020-12-08 DIAGNOSIS — G309 Alzheimer's disease, unspecified: Secondary | ICD-10-CM | POA: Diagnosis not present

## 2020-12-08 MED ORDER — DONEPEZIL HCL 10 MG PO TABS: 10.0000 mg | ORAL_TABLET | Freq: Every day | ORAL | 3 refills | Status: DC

## 2020-12-08 NOTE — Progress Notes (Signed)
PATIENT: Alexander Cummings DOB: 09-Aug-1946  REASON FOR VISIT: follow up HISTORY FROM: patient  HISTORY OF PRESENT ILLNESS: Today 12/08/20:  Alexander Cummings is a 75 year old male with a history of Alzheimer's dementia.  He returns today for follow-up.  Overall he feels that he is doing well.  He lives at home with his wife.  He is able to complete all ADLs independently.  He denies any new issues.  He operates a Teacher, music without difficulty.  Denies any trouble sleeping.  Reports that he has an injury to the right arm approximately 2 years ago from tennis.  He has been seeing Dr. Ellene Route for this.  He was recently put on gabapentin.  He also takes mirtazapine as an appetite stimulant as he was losing weight.  He continues on Aricept 10 mg at bedtime for his memory.   HISTORY Alexander Cummings a 75 y.o. year old White or Caucasian male patientseen on 05-18-2020.  patient here to talk about the  neuropsychological testing battery results, indicating Dx of   Alzheimer dementia. I see quite substantial cortical atrophy and plump gyri.   I like to increase the Alzheimer's medication- to 10 mg, and have him take a multivitamin and have OT  REVIEW OF SYSTEMS: Out of a complete 14 system review of symptoms, the patient complains only of the following symptoms, and all other reviewed systems are negative.  See HPI  ALLERGIES: No Known Allergies  HOME MEDICATIONS: Outpatient Medications Prior to Visit  Medication Sig Dispense Refill  . donepezil (ARICEPT) 10 MG tablet Take 1 tablet (10 mg total) by mouth at bedtime. 30 tablet 6  . gabapentin (NEURONTIN) 300 MG capsule Take 300 mg by mouth daily.    . mirtazapine (REMERON) 7.5 MG tablet Take 7.5 mg by mouth at bedtime.    . simvastatin (ZOCOR) 20 MG tablet Take 20 mg by mouth daily.       No facility-administered medications prior to visit.    PAST MEDICAL HISTORY: Past Medical History:  Diagnosis Date  . Adhesive capsulitis of right  shoulder 06/19/2019  . BCC (basal cell carcinoma)    face  . Carpal tunnel syndrome    right wrist  . Kidney stones   . Osteoarthritis    back, knees, neck  . Recurrent upper respiratory infection (URI)    gets colds infrequently...  . Tinea versicolor     PAST SURGICAL HISTORY: Past Surgical History:  Procedure Laterality Date  . COLONOSCOPY    . LUMBAR LAMINECTOMY/DECOMPRESSION MICRODISCECTOMY  10/03/2011   Procedure: LUMBAR LAMINECTOMY/DECOMPRESSION MICRODISCECTOMY;  Surgeon: Hosie Spangle;  Location: [facility];  Service: Neurosurgery;  Laterality: Left;  LEFT Lumbar four-five EXTRAFORAMINAL MICRODISKECTOMY  . MOHS SURGERY  01/2014  . right ctr  01/23/2019  . TONSILLECTOMY    . VASECTOMY      FAMILY HISTORY: Family History  Problem Relation Age of Onset  . Pancreatic cancer Brother 5       dx'd 5 months before death  . Dementia Mother     SOCIAL HISTORY: Social History   Socioeconomic History  . Marital status: Married    Spouse name: Not on file  . Number of children: Not on file  . Years of education: Not on file  . Highest education level: Not on file  Occupational History  . Not on file  Tobacco Use  . Smoking status: Never Smoker  . Smokeless tobacco: Never Used  Substance and Sexual Activity  .  Alcohol use: Yes    Alcohol/week: 1.0 standard drink    Types: 1 Cans of beer per week  . Drug use: No  . Sexual activity: Yes  Other Topics Concern  . Not on file  Social History Narrative  . Not on file   Social Determinants of Health   Financial Resource Strain: Not on file  Food Insecurity: Not on file  Transportation Needs: Not on file  Physical Activity: Not on file  Stress: Not on file  Social Connections: Not on file  Intimate Partner Violence: Not on file      PHYSICAL EXAM  Vitals:   12/08/20 1018  BP: 131/81  Pulse: 87  Weight: 130 lb (59 kg)  Height: 5\' 8"  (1.727 m)   Body mass index is 19.77 kg/m.    Montreal  Cognitive Assessment  12/08/2020 04/20/2020 12/10/2019  Visuospatial/ Executive (0/5) 4 3 5   Naming (0/3) 3 2 3   Attention: Read list of digits (0/2) 2 1 0  Attention: Read list of letters (0/1) 1 1 1   Attention: Serial 7 subtraction starting at 100 (0/3) 1 3 3   Language: Repeat phrase (0/2) 1 0 1  Language : Fluency (0/1) 1 0 1  Abstraction (0/2) 1 2 2   Delayed Recall (0/5) 0 2 1  Orientation (0/6) 6 6 6   Total 20 20 23     Generalized: Well developed, in no acute distress   Neurological examination  Mentation: Alert oriented to time, place, history taking. Follows all commands speech and language fluent Cranial nerve II-XII: Pupils were equal round reactive to light. Extraocular movements were full, visual field were full on confrontational test. Facial sensation and strength were normal. Uvula tongue midline. Head turning and shoulder shrug  were normal and symmetric. Motor: The motor testing reveals 5 over 5 strength of all 4 extremities. Good symmetric motor tone is noted throughout.  Sensory: Sensory testing is intact to soft touch on all 4 extremities. No evidence of extinction is noted.  Coordination: Cerebellar testing reveals good finger-nose-finger and heel-to-shin bilaterally.  Gait and station: Gait is normal.  Reflexes: Deep tendon reflexes are symmetric and normal bilaterally with the exception hyperreflexic in the right upper extremity  DIAGNOSTIC DATA (LABS, IMAGING, TESTING) - I reviewed patient records, labs, notes, testing and imaging myself where available.  Lab Results  Component Value Date   WBC 5.5 09/20/2018   HGB 14.5 09/20/2018   HCT 45.3 09/20/2018   MCV 94.6 09/20/2018   PLT 209 09/20/2018      Component Value Date/Time   NA 137 09/20/2018 1205   K 4.2 09/20/2018 1205   CL 106 09/20/2018 1205   CO2 26 09/20/2018 1205   GLUCOSE 101 (H) 09/20/2018 1205   BUN 16 09/20/2018 1205   CREATININE 0.78 09/20/2018 1205   CALCIUM 9.9 09/20/2018 1205   PROT  6.5 09/28/2011 0903   ALBUMIN 3.5 09/28/2011 0903   AST 16 09/28/2011 0903   ALT 12 09/28/2011 0903   ALKPHOS 93 09/28/2011 0903   BILITOT 1.0 09/28/2011 0903   GFRNONAA >60 09/20/2018 1205   GFRAA >60 09/20/2018 1205      ASSESSMENT AND PLAN 75 y.o. year old male  has a past medical history of Adhesive capsulitis of right shoulder (06/19/2019), BCC (basal cell carcinoma), Carpal tunnel syndrome, Kidney stones, Osteoarthritis, Recurrent upper respiratory infection (URI), and Tinea versicolor. here with :  1.  Memory disturbance--Alzheimer's disease  --Continue Aricept 10 mg at bedtime -- Memory score has remained  stable- MOCA 20/30 -- Discussed adding on Namenda provided them with some information.  They will call if they would like to start this medication otherwise we will discuss again at the next visit. -- Follow-up in 6 months or sooner if needed   I spent 25 minutes of face-to-face and non-face-to-face time with patient.  This included previsit chart review, lab review, study review, order entry, electronic health record documentation, patient education.  Ward Givens, MSN, NP-C 12/08/2020, 10:34 AM [facility] [address], [address], [place] [address] [phone]

## 2020-12-08 NOTE — Patient Instructions (Signed)
Your Plan:  Continue Aricept 10 mg at bedtime Can consider adding namenda for memory in the future If your symptoms worsen or you develop new symptoms please let us know.    Thank you for coming to see [place] at [facility]. I hope we have been able to provide you high quality care today.  You may receive a patient satisfaction survey over the next few weeks. We would appreciate your feedback and comments so that we may continue to improve ourselves and the health of our patients.  Memantine Tablets What is this medicine? MEMANTINE (MEM an teen) is used to treat dementia caused by Alzheimer's disease. This medicine may be used for other purposes; ask your health care provider or pharmacist if you have questions. COMMON BRAND NAME(S): Namenda What should I tell my health care provider before I take this medicine? They need to know if you have any of these conditions:  difficulty passing urine  kidney disease  liver disease  seizures  an unusual or allergic reaction to memantine, other medicines, foods, dyes, or preservatives  pregnant or trying to get pregnant  breast-feeding How should I use this medicine? Take this medicine by mouth with a glass of water. Follow the directions on the prescription label. You may take this medicine with or without food. Take your doses at regular intervals. Do not take your medicine more often than directed. Continue to take your medicine even if you feel better. Do not stop taking except on the advice of your doctor or health care professional. Talk to your pediatrician regarding the use of this medicine in children. Special care may be needed. Overdosage: If you think you have taken too much of this medicine contact a poison control center or emergency room at once. NOTE: This medicine is only for you. Do not share this medicine with others. What if I miss a dose? If you miss a dose, take it as soon as you can. If it is almost  time for your next dose, take only that dose. Do not take double or extra doses. If you do not take your medicine for several days, contact your health care provider. Your dose may need to be changed. What may interact with this medicine?  acetazolamide  amantadine  cimetidine  dextromethorphan  dofetilide  hydrochlorothiazide  ketamine  metformin  methazolamide  quinidine  ranitidine  sodium bicarbonate  triamterene This list may not describe all possible interactions. Give your health care provider a list of all the medicines, herbs, non-prescription drugs, or dietary supplements you use. Also tell them if you smoke, drink alcohol, or use illegal drugs. Some items may interact with your medicine. What should I watch for while using this medicine? Visit your doctor or health care professional for regular checks on your progress. Check with your doctor or health care professional if there is no improvement in your symptoms or if they get worse. You may get drowsy or dizzy. Do not drive, use machinery, or do anything that needs mental alertness until you know how this drug affects you. Do not stand or sit up quickly, especially if you are an older patient. This reduces the risk of dizzy or fainting spells. Alcohol can make you more drowsy and dizzy. Avoid alcoholic drinks. What side effects may I notice from receiving this medicine? Side effects that you should report to your doctor or health care professional as soon as possible:  allergic reactions like skin rash, itching or hives, swelling of  the face, lips, or tongue  agitation or a feeling of restlessness  depressed mood  dizziness  hallucinations  redness, blistering, peeling or loosening of the skin, including inside the mouth  seizures  vomiting Side effects that usually do not require medical attention (report to your doctor or health care professional if they continue or are  bothersome):  constipation  diarrhea  headache  nausea  trouble sleeping This list may not describe all possible side effects. Call your doctor for medical advice about side effects. You may report side effects to FDA at 1-800-FDA-1088. Where should I keep my medicine? Keep out of the reach of children. Store at room temperature between 15 degrees and 30 degrees C (59 degrees and 86 degrees F). Throw away any unused medicine after the expiration date. NOTE: This sheet is a summary. It may not cover all possible information. If you have questions about this medicine, talk to your doctor, pharmacist, or health care provider.  2021 Elsevier/Gold Standard (2013-09-02 14:10:42)

## 2020-12-11 DIAGNOSIS — G5641 Causalgia of right upper limb: Secondary | ICD-10-CM | POA: Diagnosis not present

## 2020-12-11 DIAGNOSIS — I10 Essential (primary) hypertension: Secondary | ICD-10-CM | POA: Diagnosis not present

## 2021-01-12 DIAGNOSIS — H2513 Age-related nuclear cataract, bilateral: Secondary | ICD-10-CM | POA: Diagnosis not present

## 2021-01-13 DIAGNOSIS — R29898 Other symptoms and signs involving the musculoskeletal system: Secondary | ICD-10-CM | POA: Diagnosis not present

## 2021-01-18 DIAGNOSIS — E7800 Pure hypercholesterolemia, unspecified: Secondary | ICD-10-CM | POA: Diagnosis not present

## 2021-01-18 DIAGNOSIS — R413 Other amnesia: Secondary | ICD-10-CM | POA: Diagnosis not present

## 2021-01-18 DIAGNOSIS — R430 Anosmia: Secondary | ICD-10-CM | POA: Diagnosis not present

## 2021-01-18 DIAGNOSIS — M4802 Spinal stenosis, cervical region: Secondary | ICD-10-CM | POA: Diagnosis not present

## 2021-01-18 DIAGNOSIS — R630 Anorexia: Secondary | ICD-10-CM | POA: Diagnosis not present

## 2021-01-27 DIAGNOSIS — M19042 Primary osteoarthritis, left hand: Secondary | ICD-10-CM | POA: Diagnosis not present

## 2021-01-27 DIAGNOSIS — M19041 Primary osteoarthritis, right hand: Secondary | ICD-10-CM | POA: Diagnosis not present

## 2021-01-27 DIAGNOSIS — N401 Enlarged prostate with lower urinary tract symptoms: Secondary | ICD-10-CM | POA: Diagnosis not present

## 2021-01-27 DIAGNOSIS — E7800 Pure hypercholesterolemia, unspecified: Secondary | ICD-10-CM | POA: Diagnosis not present

## 2021-01-27 DIAGNOSIS — M1990 Unspecified osteoarthritis, unspecified site: Secondary | ICD-10-CM | POA: Diagnosis not present

## 2021-02-09 DIAGNOSIS — M25571 Pain in right ankle and joints of right foot: Secondary | ICD-10-CM | POA: Diagnosis not present

## 2021-02-18 DIAGNOSIS — H25043 Posterior subcapsular polar age-related cataract, bilateral: Secondary | ICD-10-CM | POA: Diagnosis not present

## 2021-02-18 DIAGNOSIS — H18413 Arcus senilis, bilateral: Secondary | ICD-10-CM | POA: Diagnosis not present

## 2021-02-18 DIAGNOSIS — H25013 Cortical age-related cataract, bilateral: Secondary | ICD-10-CM | POA: Diagnosis not present

## 2021-02-18 DIAGNOSIS — H2513 Age-related nuclear cataract, bilateral: Secondary | ICD-10-CM | POA: Diagnosis not present

## 2021-02-18 DIAGNOSIS — H2512 Age-related nuclear cataract, left eye: Secondary | ICD-10-CM | POA: Diagnosis not present

## 2021-04-02 DIAGNOSIS — H2512 Age-related nuclear cataract, left eye: Secondary | ICD-10-CM | POA: Diagnosis not present

## 2021-04-02 DIAGNOSIS — H2511 Age-related nuclear cataract, right eye: Secondary | ICD-10-CM | POA: Diagnosis not present

## 2021-04-06 DIAGNOSIS — M533 Sacrococcygeal disorders, not elsewhere classified: Secondary | ICD-10-CM | POA: Diagnosis not present

## 2021-04-15 DIAGNOSIS — H2511 Age-related nuclear cataract, right eye: Secondary | ICD-10-CM | POA: Diagnosis not present

## 2021-04-16 DIAGNOSIS — H2511 Age-related nuclear cataract, right eye: Secondary | ICD-10-CM | POA: Diagnosis not present

## 2021-05-17 DIAGNOSIS — M19042 Primary osteoarthritis, left hand: Secondary | ICD-10-CM | POA: Diagnosis not present

## 2021-05-17 DIAGNOSIS — N401 Enlarged prostate with lower urinary tract symptoms: Secondary | ICD-10-CM | POA: Diagnosis not present

## 2021-05-17 DIAGNOSIS — M1990 Unspecified osteoarthritis, unspecified site: Secondary | ICD-10-CM | POA: Diagnosis not present

## 2021-05-17 DIAGNOSIS — E7800 Pure hypercholesterolemia, unspecified: Secondary | ICD-10-CM | POA: Diagnosis not present

## 2021-05-17 DIAGNOSIS — M19041 Primary osteoarthritis, right hand: Secondary | ICD-10-CM | POA: Diagnosis not present

## 2021-06-09 ENCOUNTER — Encounter: Payer: Self-pay | Admitting: Adult Health

## 2021-06-09 ENCOUNTER — Ambulatory Visit: Payer: PPO | Admitting: Adult Health

## 2021-06-09 ENCOUNTER — Other Ambulatory Visit: Payer: Self-pay

## 2021-06-09 VITALS — BP 130/76 | HR 47 | Ht 68.0 in | Wt 125.0 lb

## 2021-06-09 DIAGNOSIS — F0281 Dementia in other diseases classified elsewhere with behavioral disturbance: Secondary | ICD-10-CM | POA: Diagnosis not present

## 2021-06-09 DIAGNOSIS — G309 Alzheimer's disease, unspecified: Secondary | ICD-10-CM

## 2021-06-09 MED ORDER — MEMANTINE HCL 28 X 5 MG & 21 X 10 MG PO TABS: ORAL_TABLET | ORAL | 0 refills | Status: DC

## 2021-06-09 NOTE — Patient Instructions (Addendum)
Your Plan:  Continue Aricept 10 mg at bedtime Start Namenda titration pack. Call at week 4 for new prescription  Monitor Weight- if continue to decrease we will decrease Aricept to 5 mg at bedtime If your symptoms worsen or you develop new symptoms please let us know.    Thank you for coming to see [place] at [facility]. I hope we have been able to provide you high quality care today.  You may receive a patient satisfaction survey over the next few weeks. We would appreciate your feedback and comments so that we may continue to improve ourselves and the health of our patients.

## 2021-06-09 NOTE — Progress Notes (Signed)
PATIENT: Alexander Cummings DOB: 15-Mar-1946  REASON FOR VISIT: follow up HISTORY FROM: patient Primary neurologist: Dr. Brett Fairy  HISTORY OF PRESENT ILLNESS: Today 06/09/21:  Alexander Cummings is a 75 year old male with a history of Alzheimer's dementia.  He returns today for follow-up.  He is here today with his wife.  He feels that his memory has remained stable.  He lives at home with his wife.  Reports that he is able to complete all ADLs independently.  He continues to operate a motor vehicle.  He manages his own medications.  He is able to manage his own checkbook his wife oversees all of their finances.  He denies any changes in his mood or behavior.  He remains on Aricept 10 mg at bedtime.  Wife states that he has had a 5 pound weight loss since the last visit.  Denies any change in his appetite.  He is on Remeron for appetite.  12/08/20:Alexander Cummings is a 75 year old male with a history of Alzheimer's dementia.  He returns today for follow-up.  Overall he feels that he is doing well.  He lives at home with his wife.  He is able to complete all ADLs independently.  He denies any new issues.  He operates a Teacher, music without difficulty.  Denies any trouble sleeping.  Reports that he has an injury to the right arm approximately 2 years ago from tennis.  He has been seeing Dr. Ellene Route for this.  He was recently put on gabapentin.  He also takes mirtazapine as an appetite stimulant as he was losing weight.  He continues on Aricept 10 mg at bedtime for his memory.   HISTORY Alexander Cummings is a 75 y.o. year old White or Caucasian male patient seen on 05-18-2020.  patient here to talk about the  neuropsychological testing battery results, indicating Dx of   Alzheimer dementia. I see quite substantial cortical atrophy and plump gyri.   I like to increase the Alzheimer's medication- to 10 mg, and have him take a multivitamin and have OT  REVIEW OF SYSTEMS: Out of a complete 14 system review of symptoms,  the patient complains only of the following symptoms, and all other reviewed systems are negative.  See HPI  ALLERGIES: No Known Allergies  HOME MEDICATIONS: Outpatient Medications Prior to Visit  Medication Sig Dispense Refill   donepezil (ARICEPT) 10 MG tablet Take 1 tablet (10 mg total) by mouth at bedtime. 90 tablet 3   gabapentin (NEURONTIN) 300 MG capsule Take 300 mg by mouth daily.     mirtazapine (REMERON) 7.5 MG tablet Take 7.5 mg by mouth at bedtime.     No facility-administered medications prior to visit.    PAST MEDICAL HISTORY: Past Medical History:  Diagnosis Date   Adhesive capsulitis of right shoulder 06/19/2019   BCC (basal cell carcinoma)    face   Carpal tunnel syndrome    right wrist   Kidney stones    Osteoarthritis    back, knees, neck   Recurrent upper respiratory infection (URI)    gets colds infrequently...   Tinea versicolor     PAST SURGICAL HISTORY: Past Surgical History:  Procedure Laterality Date   COLONOSCOPY     LUMBAR LAMINECTOMY/DECOMPRESSION MICRODISCECTOMY  10/03/2011   Procedure: LUMBAR LAMINECTOMY/DECOMPRESSION MICRODISCECTOMY;  Surgeon: Hosie Spangle;  Location: [place] [facility];  Service: Neurosurgery;  Laterality: Left;  LEFT Lumbar four-five EXTRAFORAMINAL MICRODISKECTOMY   MOHS SURGERY  01/2014   right ctr  01/23/2019  TONSILLECTOMY     VASECTOMY      FAMILY HISTORY: Family History  Problem Relation Age of Onset   Pancreatic cancer Brother 85       dx'd 16 months before death   Dementia Mother     SOCIAL HISTORY: Social History   Socioeconomic History   Marital status: Married    Spouse name: Not on file   Number of children: Not on file   Years of education: Not on file   Highest education level: Not on file  Occupational History   Not on file  Tobacco Use   Smoking status: Never   Smokeless tobacco: Never  Substance and Sexual Activity   Alcohol use: Yes    Alcohol/week: 1.0 standard drink    Types: 1  Cans of beer per week   Drug use: No   Sexual activity: Yes  Other Topics Concern   Not on file  Social History Narrative   Not on file   Social Determinants of Health   Financial Resource Strain: Not on file  Food Insecurity: Not on file  Transportation Needs: Not on file  Physical Activity: Not on file  Stress: Not on file  Social Connections: Not on file  Intimate Partner Violence: Not on file      PHYSICAL EXAM  Vitals:   06/09/21 1021  BP: 130/76  Pulse: (!) 47  Weight: 125 lb (56.7 kg)  Height: 5\' 8"  (1.727 m)    Body mass index is 19.01 kg/m.    Montreal Cognitive Assessment  06/09/2021 12/08/2020 04/20/2020 12/10/2019  Visuospatial/ Executive (0/5) 5 4 3 5   Naming (0/3) 2 3 2 3   Attention: Read list of digits (0/2) 1 2 1  0  Attention: Read list of letters (0/1) 1 1 1 1   Attention: Serial 7 subtraction starting at 100 (0/3) 1 1 3 3   Language: Repeat phrase (0/2) 1 1 0 1  Language : Fluency (0/1) 0 1 0 1  Abstraction (0/2) 2 1 2 2   Delayed Recall (0/5) 0 0 2 1  Orientation (0/6) 5 6 6 6   Total 18 20 20 23     Generalized: Well developed, in no acute distress   Neurological examination  Mentation: Alert oriented to time, place, history taking. Follows all commands speech and language fluent Cranial nerve II-XII: Pupils were equal round reactive to light. Extraocular movements were full, visual field were full on confrontational test. Facial sensation and strength were normal. Uvula tongue midline. Head turning and shoulder shrug  were normal and symmetric. Motor: The motor testing reveals 5 over 5 strength of all 4 extremities. Good symmetric motor tone is noted throughout.  Sensory: Sensory testing is intact to soft touch on all 4 extremities. No evidence of extinction is noted.  Coordination: Cerebellar testing reveals good finger-nose-finger and heel-to-shin bilaterally.  Tremor noted in the right hand Gait and station: Gait is normal.  Reflexes: Deep  tendon reflexes are symmetric and normal bilaterally with the exception hyperreflexic in the right upper extremity  DIAGNOSTIC DATA (LABS, IMAGING, TESTING) - I reviewed patient records, labs, notes, testing and imaging myself where available.  Lab Results  Component Value Date   WBC 5.5 09/20/2018   HGB 14.5 09/20/2018   HCT 45.3 09/20/2018   MCV 94.6 09/20/2018   PLT 209 09/20/2018      Component Value Date/Time   NA 137 09/20/2018 1205   K 4.2 09/20/2018 1205   CL 106 09/20/2018 1205   CO2 26 09/20/2018  1205   GLUCOSE 101 (H) 09/20/2018 1205   BUN 16 09/20/2018 1205   CREATININE 0.78 09/20/2018 1205   CALCIUM 9.9 09/20/2018 1205   PROT 6.5 09/28/2011 0903   ALBUMIN 3.5 09/28/2011 0903   AST 16 09/28/2011 0903   ALT 12 09/28/2011 0903   ALKPHOS 93 09/28/2011 0903   BILITOT 1.0 09/28/2011 0903   GFRNONAA >60 09/20/2018 1205   GFRAA >60 09/20/2018 1205      ASSESSMENT AND PLAN 75 y.o. year old male  has a past medical history of Adhesive capsulitis of right shoulder (06/19/2019), BCC (basal cell carcinoma), Carpal tunnel syndrome, Kidney stones, Osteoarthritis, Recurrent upper respiratory infection (URI), and Tinea versicolor. here with :  1.  Memory disturbance--Alzheimer's disease  --Continue Aricept 10 mg at bedtime --Monitor weight advised that if his weight continues to decrease over the next month we can decrease his Aricept to 5 mg at bedtime and continue to monitor -- Memory score MOCA 18/30 -- Start Namenda titration pack advised at the beginning of week for they will call for new prescription --Reviewed potential side effects with the patient and his wife -- Follow-up in 6 months or sooner if needed   I spent 30 minutes of face-to-face and non-face-to-face time with patient.  This included previsit chart review, discussing memory score and new medication Eddy, MSN, NP-C 06/09/2021, 10:12 AM [facility] [address]  [place], [place] [address] [phone]

## 2021-07-27 DIAGNOSIS — E7800 Pure hypercholesterolemia, unspecified: Secondary | ICD-10-CM | POA: Diagnosis not present

## 2021-07-27 DIAGNOSIS — F0390 Unspecified dementia without behavioral disturbance: Secondary | ICD-10-CM | POA: Diagnosis not present

## 2021-07-27 DIAGNOSIS — J300 Vasomotor rhinitis: Secondary | ICD-10-CM | POA: Diagnosis not present

## 2021-07-27 DIAGNOSIS — M4802 Spinal stenosis, cervical region: Secondary | ICD-10-CM | POA: Diagnosis not present

## 2021-08-19 DIAGNOSIS — M5412 Radiculopathy, cervical region: Secondary | ICD-10-CM | POA: Diagnosis not present

## 2021-08-19 DIAGNOSIS — R29898 Other symptoms and signs involving the musculoskeletal system: Secondary | ICD-10-CM | POA: Diagnosis not present

## 2021-08-25 DIAGNOSIS — R29898 Other symptoms and signs involving the musculoskeletal system: Secondary | ICD-10-CM | POA: Diagnosis not present

## 2021-08-25 DIAGNOSIS — M5412 Radiculopathy, cervical region: Secondary | ICD-10-CM | POA: Diagnosis not present

## 2021-09-03 DIAGNOSIS — E7800 Pure hypercholesterolemia, unspecified: Secondary | ICD-10-CM | POA: Diagnosis not present

## 2021-09-03 DIAGNOSIS — M19041 Primary osteoarthritis, right hand: Secondary | ICD-10-CM | POA: Diagnosis not present

## 2021-09-03 DIAGNOSIS — M1990 Unspecified osteoarthritis, unspecified site: Secondary | ICD-10-CM | POA: Diagnosis not present

## 2021-09-03 DIAGNOSIS — M19042 Primary osteoarthritis, left hand: Secondary | ICD-10-CM | POA: Diagnosis not present

## 2021-09-03 DIAGNOSIS — N401 Enlarged prostate with lower urinary tract symptoms: Secondary | ICD-10-CM | POA: Diagnosis not present

## 2021-09-03 DIAGNOSIS — F0390 Unspecified dementia without behavioral disturbance: Secondary | ICD-10-CM | POA: Diagnosis not present

## 2021-09-07 DIAGNOSIS — R29898 Other symptoms and signs involving the musculoskeletal system: Secondary | ICD-10-CM | POA: Diagnosis not present

## 2021-09-07 DIAGNOSIS — M5412 Radiculopathy, cervical region: Secondary | ICD-10-CM | POA: Diagnosis not present

## 2021-09-10 DIAGNOSIS — R29898 Other symptoms and signs involving the musculoskeletal system: Secondary | ICD-10-CM | POA: Diagnosis not present

## 2021-09-10 DIAGNOSIS — M5412 Radiculopathy, cervical region: Secondary | ICD-10-CM | POA: Diagnosis not present

## 2021-09-15 DIAGNOSIS — M5412 Radiculopathy, cervical region: Secondary | ICD-10-CM | POA: Diagnosis not present

## 2021-09-15 DIAGNOSIS — R29898 Other symptoms and signs involving the musculoskeletal system: Secondary | ICD-10-CM | POA: Diagnosis not present

## 2021-09-17 DIAGNOSIS — M5412 Radiculopathy, cervical region: Secondary | ICD-10-CM | POA: Diagnosis not present

## 2021-09-17 DIAGNOSIS — R29898 Other symptoms and signs involving the musculoskeletal system: Secondary | ICD-10-CM | POA: Diagnosis not present

## 2021-10-07 DIAGNOSIS — D225 Melanocytic nevi of trunk: Secondary | ICD-10-CM | POA: Diagnosis not present

## 2021-10-07 DIAGNOSIS — D485 Neoplasm of uncertain behavior of skin: Secondary | ICD-10-CM | POA: Diagnosis not present

## 2021-10-07 DIAGNOSIS — Z85828 Personal history of other malignant neoplasm of skin: Secondary | ICD-10-CM | POA: Diagnosis not present

## 2021-10-07 DIAGNOSIS — L905 Scar conditions and fibrosis of skin: Secondary | ICD-10-CM | POA: Diagnosis not present

## 2021-10-07 DIAGNOSIS — L578 Other skin changes due to chronic exposure to nonionizing radiation: Secondary | ICD-10-CM | POA: Diagnosis not present

## 2021-10-07 DIAGNOSIS — D2271 Melanocytic nevi of right lower limb, including hip: Secondary | ICD-10-CM | POA: Diagnosis not present

## 2021-10-07 DIAGNOSIS — Z85820 Personal history of malignant melanoma of skin: Secondary | ICD-10-CM | POA: Diagnosis not present

## 2021-10-07 DIAGNOSIS — B360 Pityriasis versicolor: Secondary | ICD-10-CM | POA: Diagnosis not present

## 2021-10-07 DIAGNOSIS — L821 Other seborrheic keratosis: Secondary | ICD-10-CM | POA: Diagnosis not present

## 2021-10-14 DIAGNOSIS — M4802 Spinal stenosis, cervical region: Secondary | ICD-10-CM | POA: Diagnosis not present

## 2021-10-14 DIAGNOSIS — E7800 Pure hypercholesterolemia, unspecified: Secondary | ICD-10-CM | POA: Diagnosis not present

## 2021-10-14 DIAGNOSIS — F0390 Unspecified dementia without behavioral disturbance: Secondary | ICD-10-CM | POA: Diagnosis not present

## 2021-10-14 DIAGNOSIS — Z0000 Encounter for general adult medical examination without abnormal findings: Secondary | ICD-10-CM | POA: Diagnosis not present

## 2021-12-08 ENCOUNTER — Ambulatory Visit: Payer: PPO | Admitting: Neurology

## 2021-12-08 ENCOUNTER — Encounter: Payer: Self-pay | Admitting: Neurology

## 2021-12-08 VITALS — BP 111/49 | HR 47 | Ht 68.0 in | Wt 127.0 lb

## 2021-12-08 DIAGNOSIS — R4701 Aphasia: Secondary | ICD-10-CM

## 2021-12-08 DIAGNOSIS — R9089 Other abnormal findings on diagnostic imaging of central nervous system: Secondary | ICD-10-CM | POA: Diagnosis not present

## 2021-12-08 DIAGNOSIS — F03918 Unspecified dementia, unspecified severity, with other behavioral disturbance: Secondary | ICD-10-CM

## 2021-12-08 DIAGNOSIS — R482 Apraxia: Secondary | ICD-10-CM | POA: Diagnosis not present

## 2021-12-08 MED ORDER — MEMANTINE HCL 10 MG PO TABS: 10.0000 mg | ORAL_TABLET | Freq: Two times a day (BID) | ORAL | 3 refills | Status: DC

## 2021-12-08 NOTE — Progress Notes (Signed)
Provider:  Larey Seat, MD  Primary Care Physician:  Shirline Frees, MD [place] [address]     Referring Provider: Dr. Apolonio Schneiders, MD ,          Chief Complaint according to patient   Patient presents with:      Patient  with non orthopedic concerns, DEMENTIA> (Initial Visit)           HISTORY OF PRESENT ILLNESS:  SHAWNDELL Cummings is a 76 y.o.  Caucasian male patient seen on 12-08-2021: Alzheimer patient here  for follow up with spouse. Overall about the same. Wife states he still driving. They handle financial affairs together, tax returns.  Gabapentin 300 mg daily prescribed by another provider. Remeron 7.5 mg at night for sleep prescribed by Dr Kenton Kingfisher for appetite. Memory support by Namenda and Aricept.  His visits with neurosurgeon ended in dx of Cervical radiculopathy causing pain and Weakness of right hand, he has had PT. Reports this is not getting better. Had carpal tunnel surgery  in 12-2018, and NCV in January 2021: "This is an abnormal study.  There is electrodiagnostic evidence of mild slowing across the right wrist, consistent with diagnosis of mild right carpal tunnel syndrome.  There is no evidence of right cervical radiculopathy".  [facility] repeated NCV , 03-2020: Conclusion:    This study is abnormal.  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  There is also electrodiagnostic evidence for mild chronic C6/C8 radiculopathy without evidence for ongoing denervation.   Now here for Alzheimer Dementia follow up, memory testing q 6 month. Namenda will be 10 mg bid.        05-18-2020.  patient here to talk about the  neuropsychological testing battery results, indicating Dx of   Alzheimer dementia. I see quite substantial cortical atrophy and plump gyri.   I like to increase the Alzheimer's medication- to 10 mg, and have him take a multivitamin and have OT.        04-20-2020, after neuropsychological testing . They had the neuro psych completed with Dr. Ane Payment, but have not heard about results yet.  We did not get results of these testing batteries. Carpal tunnel ? He has seen a Dr. Roberts Gaudy , MD, PhD,  orthopedist at [facility] - who wanted yet another NCV and EMG for this patient.  DR. Ramonita Lab had referred him.      12/10/2019 in a RV.  Wife is here in person, daughter is on the phone. The patient is defensive.  His MOCA was 23/ 30 , his handwriting is changed, his word output is reduced.  His MRI brain showed left sided atrophy, which involves the word-finding part. He replaces some words with similar words, such as body temperature with pulse,   We are now repeating an NCV and EMG to help differentiate his dominant hand/ arm dysfunction from neck injury/ DDD or from CNS changes. Also referral to neuropsychology testing . Mental status / memory- short term memory loss. He had failed 4-5 years ago a memory test for long term care insurance.  He has progressed over the last 6 month , according to his RN daughter, [name].     10-29-2019 He was seen here as an URGENT  referral on  from Dr Apolonio Schneiders* for a neck-spine and abnormal brain MRI evaluation, while the family is concerned about a memory loss.  Chief  concern according to patient : abnormal MRI of cervical spine is not families main concern.    I have the pleasure of seeing [name] today, a right -handed White or Caucasian male with a Master's degree in accounting- MBA.  He  has a past medical history of Adhesive capsulitis of right shoulder (06/19/2019), BCC (basal cell carcinoma), Carpal tunnel syndrome, Kidney stones, Osteoarthritis, Recurrent upper respiratory infection (URI), and Tinea versicolor.   Family noted symptoms about a year ago, progressing over the last 48 month [name]- RN and daughter). Wife noted decrease in weight over 24 month,  losing 20 pounds. Noted word finding problems, stumbling and stiffness, clumsiness, and cognitive impairment. "playing charades' at home trying to finish his sentences. Apraxia with common tasks  like how to use a credit card reader.   Changing of lanes when driving- wife bought a lane alarm.  Forgets turn signal.  Anosmia and ageusia after a fall from a ladder- 14-Feb-2016 MRI is in [facility]- no abnormalities.  New MRI images are not available- speaks of vast changes. .   Family medical  history: Mother died at age 58, late onset dementia. Father died at 46 Nov.1st 2018/02/13.    Social history: Patient  retired from [employer] and lives in a household with 2 persons. Family status is married , with adult children, grandchildren. One dog.   Tobacco use; never.  ETOH use : one beer a week. Caffeine intake in form of Coffee( 2 cups ) Soda( rare) Tea ( rare ) or energy drinks. Regular exercise in form of tennis, golf.  Walking the dog.  Hobbies : tennis.      Review of Systems: Out of a complete 14 system review, the patient complains of only the following symptoms, and all other reviewed systems are negative.: MMSE 28-30 here and 26-30 at Dr. Kenton Kingfisher.   Dr. Sherwood Gambler did Surgery 02/14/2011 lumbar spine. NCV and EMG with Dr Nelva Bush. Delayed nerve conduction medial nerve right side, which is expected post cap rpal tunnel op.    Cold and weakness in right hand, tremor, affecting writing, golf and tennis game.    Social History   Socioeconomic History   Marital status: Married    Spouse name: Not on file   Number of children: Not on file   Years of education: Not on file   Highest education level: Not on file  Occupational History   Not on file  Tobacco Use   Smoking status: Never   Smokeless tobacco: Never  Substance and Sexual Activity   Alcohol use: Yes    Alcohol/week: 1.0 standard drink    Types: 1 Cans of beer per week   Drug use: No   Sexual activity: Yes  Other Topics Concern   Not on file   Social History Narrative   Not on file   Social Determinants of Health   Financial Resource Strain: Not on file  Food Insecurity: Not on file  Transportation Needs: Not on file  Physical Activity: Not on file  Stress: Not on file  Social Connections: Not on file    Family History  Problem Relation Age of Onset   Pancreatic cancer Brother 11       dx'd 5 months before death   Dementia Mother     Past Medical History:  Diagnosis Date   Adhesive capsulitis of right shoulder 06/19/2019   BCC (basal cell carcinoma)    face   Carpal tunnel syndrome    right  wrist   Kidney stones    Osteoarthritis    back, knees, neck   Recurrent upper respiratory infection (URI)    gets colds infrequently...   Tinea versicolor     Past Surgical History:  Procedure Laterality Date   COLONOSCOPY     LUMBAR LAMINECTOMY/DECOMPRESSION MICRODISCECTOMY  10/03/2011   Procedure: LUMBAR LAMINECTOMY/DECOMPRESSION MICRODISCECTOMY;  Surgeon: Hosie Spangle;  Location: [facility];  Service: Neurosurgery;  Laterality: Left;  LEFT Lumbar four-five EXTRAFORAMINAL MICRODISKECTOMY   MOHS SURGERY  01/2014   right ctr  01/23/2019   TONSILLECTOMY     VASECTOMY       Current Outpatient Medications on File Prior to Visit  Medication Sig Dispense Refill   donepezil (ARICEPT) 10 MG tablet Take 1 tablet (10 mg total) by mouth at bedtime. 90 tablet 3   gabapentin (NEURONTIN) 300 MG capsule Take 300 mg by mouth daily.     memantine (NAMENDA TITRATION PAK) tablet pack 5 mg/day for =1 week; 5 mg twice daily for =1 week; 15 mg/day given in 5 mg and 10 mg separated doses for =1 week; then 10 mg twice daily 49 tablet 0   mirtazapine (REMERON) 7.5 MG tablet Take 7.5 mg by mouth at bedtime.     No current facility-administered medications on file prior to visit.    No Known Allergies  Physical exam:  Today's Vitals   12/08/21 0942  BP: (!) 111/49  Pulse: (!) 47  Weight: 127 lb (57.6 kg)  Height: 5\' 8"   (1.727 m)   Body mass index is 19.31 kg/m.   Wt Readings from Last 3 Encounters:  12/08/21 127 lb (57.6 kg)  06/09/21 125 lb (56.7 kg)  12/08/20 130 lb (59 kg)     Ht Readings from Last 3 Encounters:  12/08/21 5\' 8"  (1.727 m)  06/09/21 5\' 8"  (1.727 m)  12/08/20 5\' 8"  (1.727 m)      General: The patient is awake, alert and appears not in acute distress. The patient is well groomed. He is tanned.  Head: Normocephalic, atraumatic. Neck is supple. Mallampati 1- wide open with retrognathia.,  neck circumference 15.5  inches . Nasal airflow patent.  Dental status: intact.   Cardiovascular:  Regular rate and cardiac rhythm by pulse,  without distended neck veins. Respiratory: Lungs are clear to auscultation.  Skin:  Without evidence of ankle edema, or rash. Trunk: The patient's posture is erect.    Neurologic exam : The patient is awake and alert, oriented to place and time.   Memory subjective described -  defensiveness-  Attention span & concentration ability appears abnormal, he is angry - about the test, about the test results not being here. Speech is fluent,  without  dysarthria, dysphonia or aphasia.  Mood and affect are slightly agitated.   Montreal Cognitive Assessment  12/08/2021 06/09/2021 12/08/2020 04/20/2020 12/10/2019  Visuospatial/ Executive (0/5) 3 5 4 3 5   Naming (0/3) 3 2 3 2 3   Attention: Read list of digits (0/2) 0 1 2 1  0  Attention: Read list of letters (0/1) 1 1 1 1 1   Attention: Serial 7 subtraction starting at 100 (0/3) 3 1 1 3 3   Language: Repeat phrase (0/2) 0 1 1 0 1  Language : Fluency (0/1) 0 0 1 0 1  Abstraction (0/2) 2 2 1 2 2   Delayed Recall (0/5) 3 0 0 2 1  Orientation (0/6) 6 5 6 6 6   Total 21 18 20 20 23    MMSE -  Mini Mental State Exam 10/29/2019  Orientation to time 5  Orientation to Place 5  Registration 3  Attention/ Calculation 4  Recall 3  Language- name 2 objects 2  Language- repeat 1  Language- follow 3 step command 3  Language-  read & follow direction 1  Write a sentence 1  Copy design 0  Total score 28      Cranial nerves: no loss of smell or taste reported  Pupils are equal and briskly reactive to light. Funduscopic exam intact.  Extraocular movements in vertical and horizontal planes were intact and without nystagmus. No Diplopia. Visual fields by finger perimetry are intact. Hearing was intact to soft voice and finger rubbing.    Facial sensation intact to fine touch.  Facial motor strength is symmetric and tongue and uvula move midline.  Neck ROM : rotation, tilt and flexion extension were normal for age . THe right shoulder shrug was symmetrical.    Motor exam:  Symmetric bulk, tone and ROM.   He has problems with dexterity, handwriting , drawing.  Waxy increased tone without cog wheeling, symmetric grip strength- reduced in both hands.  Sensory:  Fine touch, pinprick and vibration were tested  and normal.  Proprioception tested in the upper extremities was normal.   Coordination: Rapid alternating movements in the fingers/hands were slowed The Finger-to-nose maneuver was intact without evidence of ataxia, dysmetria or tremor.   Gait and station: Patient could rise unassisted from a seated position, walked without assistive device. Increased width, normal arm swing, slightly stooped.  Stance is of normal width/ base and the patient turned with 4 steps.  Toe and heel walk were intact. Deep tendon reflexes: in the upper and lower extremities are symmetric and very brisk- crossed reflex reaction . Babinski response was deferred.     There is dementia with behavioural changes.  The patient had an episode when he misplaced a gun while home repairs were made, and then forgot and thought the gun was stolen. He had placed in the attic, and was repeatedly denying he placed it there, He remembered 5  Days later.   After spending a total time of  20  minutes face to face and additional time for physical and  neurologic examination, review of laboratory studies,  personal review of imaging studies and discussion with the family in person and on the phone (daughter Colletta [place]) , reports and results of other testing and review of referral information / records as far as provided in visit, I have established the following assessments:  We have no results form his  referral to neuropsychology testing .  Mental status / memory- short term memory loss. He had failed 4-5 years ago a memory test for long term care insurance.   He has progressed over the last 6 month , according to his daughter, [name], RN.  I would like to thank Shirline Frees, MD and Dr. Apolonio Schneiders, MD,  for allowing me to meet with and to take care of this pleasant patient.  In short, Alexander Cummings is presenting with cognitive dysfunction, a symptom that can be attributed to dysfunction and atrophy in left brain hemisphere.  He has dementia - and progression.  I plan to follow up either personally if the neuro psychological results are made available- I do not think that Mr. Mcnellis has insight in his condition and will likely overestimate his ability, and underestimate his cognitive impairment.  He speaks in incomplete sentences. He is to be referred  to the memory clinic at [facility].      CC: I will share my notes with PCP/ orthopedist   Larey Seat, MD      Addendum from 11-04-2019- Dear Dr. Kenton Kingfisher and Dr. Apolonio Schneiders,  I was finally able to review the MRI from 15 October 2019.  This is a patient who has a history of right carpal tunnel surgery, fell from a ladder approximately 30 months ago which resulted in loss of sense of smell after head injury.  He continued to have a right upper extremity weakness numbness in the ulnar fingers and complains about dropping objects.  His cervical MRI was performed on 15 October 2019.  The interspace between C1 and 2 at the craniocervical junction was unremarkable as was C2-C3.  There is  already moderate spinal canal stenosis noted with flattening of the cord between the third and fourth cervical vertebra and severe left uncovertebral osteophytosis with severe left-sided facet arthrosis.  The neural exit foramina are narrow more severe on the left than on the right.  This continues to C3-C5 with moderate bulging, a narrowed canal, with moderate severe left-sided facet arthrosis moderate spinal canal stenosis severe left uncovertebral osteophytosis and neural foraminal narrowing.  C5 and C6 have the same findings moderate severe left uncovertebral osteophytosis at this time severe left facet arthrosis but no significant spinal stenosis severe left-sided foraminal from foraminal narrowing here the findings on the right and milder than on the left.  There was no spinal canal stenosis noted between the sixth and seventh vertebra. These findings of partially severe foraminal cervical stenosis and spinal stenosis could explain left-sided pain syndrome, hyperreflexia, left extremity weakness or dysesthesias. MRI brain of the same date without contrast showed asymmetric left parietal atrophy with sulci widening accordingly in distribution left over right.   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  There was some verbal trouble - but also right hand and foot clumsiness. He has burning sensation and sometimes an involuntary movement of the hand.  He does have right exit foraminal stenosis in Cervical neck , seen on MRI- but he presents more with apraxia.  There was no spinal canal stenosis noted between the sixth and seventh vertebra. I increased Aricept  To 10 mg from 5 mg -  He takes a bite of banana with it .     12-08-2021;   1) continues to report right hand ( dominant) coldness, pain and weakness.  Has 3 possible sources, spinal stenosis, radiculopathy , carpal tunnel, and brain atrophy with left dominance, affecting work finding and right  dexterity.  I see no evidence of cortico-basal degeneration.   Alzheimer's dementia by neuropsychological evaluation,  continue namenda and Aricept.  Rv in 6-8 months for MMSE.    Electronically signed by: Larey Seat, MD 12/08/2021 10:10 AM  [facility] Board certified by The [employer] of Sleep Medicine and Diplomate of the [employer] of Sleep Medicine. Board certified In Neurology through the [place], Fellow of the [employer] of Neurology. Medical Director of [facility].

## 2021-12-08 NOTE — Patient Instructions (Signed)
Alzheimer's Disease Caregiver Guide Alzheimer's disease is a condition that makes a person: Forget things. Act differently. Have trouble paying attention and doing simple tasks. These things get worse with time. The tips below can help you care for the person. How to help manage lifestyle changes Tips to help with symptoms Be calm and patient. Give simple, short answers to questions. Avoid correcting the person in a negative way. Try not to take things personally, even if the person forgets your name. Do not argue with the person. This may make the person more upset. Tips to lessen frustration Make appointments and do daily tasks when the person is at his or her best. Take your time. Simple tasks may take longer. Allow plenty of time to complete tasks. Limit choices for the person. Involve the person in what you are doing. Keep things organized: Keep a daily routine. Organize medicines in a pillbox for each day of the week. Keep a calendar in a central location to remind the person of meetings or other activities. Avoid new or crowded places, if possible. Use simple words, short sentences, and a calm voice. Only give one direction at a time. Buy clothes and shoes that are easy to put on and take off. Try to change the subject if the person becomes frustrated or angry. Tips to prevent injury  Keep floors clear. Remove rugs, magazine racks, and floor lamps. Keep hallways well-lit. Put a handrail and non-slip mat in the bathtub or shower. Put childproof locks on cabinets that have dangerous items in them. These items include medicine, alcohol, guns, toxic cleaning items, sharp tools, matches, and lighters. Put locks on doors where the person cannot see or reach them. This helps keep the person from going out of the house and getting lost. Be ready for emergencies. Keep a list of emergency phone numbers and addresses close by. Remove car keys and lock garage doors so that the person  does not try to drive. Bracelets may be worn that track location and identify the person as having memory problems. This should be worn at all times for safety. Tips for the future  Discuss financial and legal planning early. People with this disease have trouble managing their money as the disease gets worse. Get help from a professional. Talk about advance directives, safety, and daily care. Take these steps: Create a living will and choose a power of attorney. This is someone who can make decisions for the person with Alzheimer's disease when he or she can no longer do so. Discuss driving safety and when to stop driving. The person's doctor can help with this. If the person lives alone, make sure he or she is safe. Some people need extra help at home. Other people need more care at a nursing home or care center. How to recognize changes in the person's condition With this disease, memory problems and confusion slowly get worse. In time, the person may not know his or her friends and family members. The disease can also cause changes in behavior and mood, such as anxiety or anger. The person may see, hear, taste, smell, or feel things that are not real (hallucinate). These changes can come on all of a sudden. They may happen in response to something such as: Pain. An infection. Changes in temperature or noise. Too much stimulation. Feeling lost or scared. Medicines. Where to find support Find out about services that can provide short-term care (respite care). These can allow you to take a break when  you need it. Join a support group near you. These groups can help you: Learn ways to manage stress. Share experiences with others. Get emotional comfort and support. Learn about caregiving as the disease gets worse. Know what community resources are available. Where to find more information Alzheimer's Association: [employer] Contact a doctor if: The person has a fever. The person has a  sudden behavior change that does not get better with calming strategies. The person is not able to take care of himself or herself at home. You are no longer able to care for the person. Get help right away if: The person has a sudden increase in confusion or new hallucinations. The person threatens you or anyone else, including himself or herself. Get help right away if you feel like your loved one may hurt himself or herself or others, or has thoughts about taking his or her own life. Go to your nearest emergency room or: Call your local emergency services (911 in the [place] Call the [facility] at 707-778-3520 or 988 in the [place] This is open 24 hours a day. Text the Crisis Text Line at 8645068312. Summary Alzheimer's disease causes a person to forget things. A person who has this condition may have trouble doing simple tasks. Take steps to keep the person from getting hurt. Plan for future care. You can find support by joining a support group near you. This information is not intended to replace advice given to you by your health care provider. Make sure you discuss any questions you have with your health care provider. Document Revised: 06/09/2021 Document Reviewed: 03/02/2020 Elsevier Patient Education  2022 [employer].

## 2021-12-28 ENCOUNTER — Other Ambulatory Visit: Payer: Self-pay | Admitting: Adult Health

## 2021-12-28 DIAGNOSIS — E7800 Pure hypercholesterolemia, unspecified: Secondary | ICD-10-CM | POA: Diagnosis not present

## 2021-12-28 DIAGNOSIS — F0390 Unspecified dementia without behavioral disturbance: Secondary | ICD-10-CM | POA: Diagnosis not present

## 2021-12-30 DIAGNOSIS — E7800 Pure hypercholesterolemia, unspecified: Secondary | ICD-10-CM | POA: Diagnosis not present

## 2021-12-30 DIAGNOSIS — N401 Enlarged prostate with lower urinary tract symptoms: Secondary | ICD-10-CM | POA: Diagnosis not present

## 2022-03-16 DIAGNOSIS — H903 Sensorineural hearing loss, bilateral: Secondary | ICD-10-CM | POA: Diagnosis not present

## 2022-03-28 ENCOUNTER — Telehealth: Payer: Self-pay | Admitting: Neurology

## 2022-03-28 MED ORDER — DONEPEZIL HCL 10 MG PO TABS: 10.0000 mg | ORAL_TABLET | Freq: Every day | ORAL | 1 refills | Status: DC

## 2022-03-28 NOTE — Telephone Encounter (Signed)
E-scribed refill as requested. 

## 2022-03-28 NOTE — Telephone Encounter (Signed)
Pt wife requesting refill for pt of donepezil (ARICEPT) 10 MG tablet at [facility].  ?

## 2022-03-29 DIAGNOSIS — M4802 Spinal stenosis, cervical region: Secondary | ICD-10-CM | POA: Diagnosis not present

## 2022-03-29 DIAGNOSIS — H903 Sensorineural hearing loss, bilateral: Secondary | ICD-10-CM | POA: Diagnosis not present

## 2022-03-29 DIAGNOSIS — E7800 Pure hypercholesterolemia, unspecified: Secondary | ICD-10-CM | POA: Diagnosis not present

## 2022-03-29 DIAGNOSIS — F5101 Primary insomnia: Secondary | ICD-10-CM | POA: Diagnosis not present

## 2022-03-29 DIAGNOSIS — F0390 Unspecified dementia without behavioral disturbance: Secondary | ICD-10-CM | POA: Diagnosis not present

## 2022-04-28 DIAGNOSIS — F0390 Unspecified dementia without behavioral disturbance: Secondary | ICD-10-CM | POA: Diagnosis not present

## 2022-04-28 DIAGNOSIS — N3281 Overactive bladder: Secondary | ICD-10-CM | POA: Diagnosis not present

## 2022-04-28 DIAGNOSIS — R634 Abnormal weight loss: Secondary | ICD-10-CM | POA: Diagnosis not present

## 2022-04-28 DIAGNOSIS — M79601 Pain in right arm: Secondary | ICD-10-CM | POA: Diagnosis not present

## 2022-04-29 ENCOUNTER — Ambulatory Visit
Admission: RE | Admit: 2022-04-29 | Discharge: 2022-04-29 | Disposition: A | Discharge status: Home / Self Care | Payer: PPO | Source: Ambulatory Visit | Attending: Family Medicine | Admitting: Family Medicine

## 2022-04-29 ENCOUNTER — Other Ambulatory Visit: Payer: Self-pay | Admitting: Family Medicine

## 2022-04-29 DIAGNOSIS — M25521 Pain in right elbow: Secondary | ICD-10-CM | POA: Diagnosis not present

## 2022-04-29 DIAGNOSIS — M19031 Primary osteoarthritis, right wrist: Secondary | ICD-10-CM | POA: Diagnosis not present

## 2022-04-29 DIAGNOSIS — M79601 Pain in right arm: Secondary | ICD-10-CM

## 2022-04-29 DIAGNOSIS — M25421 Effusion, right elbow: Secondary | ICD-10-CM | POA: Diagnosis not present

## 2022-04-29 DIAGNOSIS — S6991XA Unspecified injury of right wrist, hand and finger(s), initial encounter: Secondary | ICD-10-CM | POA: Diagnosis not present

## 2022-05-27 DIAGNOSIS — G319 Degenerative disease of nervous system, unspecified: Secondary | ICD-10-CM | POA: Diagnosis not present

## 2022-05-27 DIAGNOSIS — G5601 Carpal tunnel syndrome, right upper limb: Secondary | ICD-10-CM | POA: Diagnosis not present

## 2022-06-22 DIAGNOSIS — M79641 Pain in right hand: Secondary | ICD-10-CM | POA: Diagnosis not present

## 2022-07-11 ENCOUNTER — Encounter: Payer: Self-pay | Admitting: Neurology

## 2022-07-11 ENCOUNTER — Ambulatory Visit: Payer: PPO | Admitting: Neurology

## 2022-07-11 VITALS — BP 111/62 | HR 50 | Ht 68.0 in | Wt 118.0 lb

## 2022-07-11 DIAGNOSIS — R292 Abnormal reflex: Secondary | ICD-10-CM | POA: Diagnosis not present

## 2022-07-11 DIAGNOSIS — F02B18 Dementia in other diseases classified elsewhere, moderate, with other behavioral disturbance: Secondary | ICD-10-CM | POA: Diagnosis not present

## 2022-07-11 DIAGNOSIS — G309 Alzheimer's disease, unspecified: Secondary | ICD-10-CM | POA: Diagnosis not present

## 2022-07-11 DIAGNOSIS — R9089 Other abnormal findings on diagnostic imaging of central nervous system: Secondary | ICD-10-CM

## 2022-07-11 DIAGNOSIS — F0390 Unspecified dementia without behavioral disturbance: Secondary | ICD-10-CM | POA: Insufficient documentation

## 2022-07-11 DIAGNOSIS — R482 Apraxia: Secondary | ICD-10-CM | POA: Diagnosis not present

## 2022-07-11 DIAGNOSIS — G3109 Other frontotemporal dementia: Secondary | ICD-10-CM | POA: Diagnosis not present

## 2022-07-11 DIAGNOSIS — G249 Dystonia, unspecified: Secondary | ICD-10-CM | POA: Insufficient documentation

## 2022-07-11 DIAGNOSIS — R4701 Aphasia: Secondary | ICD-10-CM

## 2022-07-11 MED ORDER — MEMANTINE HCL ER 21 MG PO CP24: 21.0000 mg | ORAL_CAPSULE | Freq: Every day | ORAL | 5 refills | Status: AC

## 2022-07-11 NOTE — Addendum Note (Signed)
Addended by: Larey Seat on: 07/11/2022 09:56 AM   Modules accepted: Orders

## 2022-07-11 NOTE — Addendum Note (Signed)
Addended by: Larey Seat on: 07/11/2022 04:38 PM   Modules accepted: Orders

## 2022-07-11 NOTE — Progress Notes (Addendum)
Provider:  Larey Seat, MD  Primary Care Physician:  Shirline Frees, MD [place] [id number]     Referring Provider: Dr. Apolonio Schneiders, MD ,          Chief Complaint according to patient   Patient presents with:      Patient  with non orthopedic concerns, DEMENTIA> (Initial Visit)           HISTORY OF PRESENT ILLNESS:  Alexander Cummings is a 76 y.o.  Caucasian male patient seen on 07-11-2022, with wife. Reports no new concerns. Wife reports he only takes namenda once daily. (We decided to go to a 24 XR form of the medication after this visit concluded. ) He is visibly losing muscle mass, lost weight - he looks frail.  his memory test reveals tremor in the drawing parts and a lower score overall .  His right arm is flexed, the hand is cooler, his pulse is slow and regular. Capillary refill is slower in the fingers of the right, dominant hand, no swelling, no discoloration, no rash-   , His memory is further declined.  orthopedist, shoulder- C spine evaluation by eval, NS : Dr Ellene Route.   I am more concerned about a dementia with frontal lobe involvement. Needing  a more recent NCV/ EMG for the upper extremities.  Vision - he looks for objects,  can't see some objects in front of him. Wife reports visio spatial difficulties , she wonders if he has a central scotoma.  Supposingly "Normal eye exam"- but needs visual field test.  Hearing impaired - has trouble to place the hearing aids by himself.       12-08-2021: Alzheimer patient here  for follow up with spouse. Overall about the same. Wife states he still driving. They handle financial affairs together, tax returns.  Gabapentin 300 mg daily prescribed by another provider. Remeron 7.5 mg at night for sleep prescribed by Dr Kenton Kingfisher for appetite. Memory support by Namenda and Aricept.  His visits with neurosurgeon ended in dx of Cervical radiculopathy causing pain and Weakness of right hand, he has had  PT. Reports this is not getting better. Had carpal tunnel surgery  in 12-2018, and NCV in January 2021: "This is an abnormal study.  There is electrodiagnostic evidence of mild slowing across the right wrist, consistent with diagnosis of mild right carpal tunnel syndrome.  There is no evidence of right cervical radiculopathy".  [facility] repeated NCV , 03-2020: Conclusion:    This study is abnormal.  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  There is also electrodiagnostic evidence for mild chronic C6/C8 radiculopathy without evidence for ongoing denervation.   Now here for Alzheimer Dementia follow up, memory testing q 6 month. Namenda will be 10 mg bid. There is dementia with behavioural changes.  The patient had an episode when he misplaced a gun while home repairs were made, and then forgot and thought the gun was stolen. He had placed in the attic, and was repeatedly denying he placed it there, He remembered 5  Days later.        05-18-2020.  patient here to talk about the  neuropsychological testing battery results, indicating Dx of   Alzheimer dementia. I see quite substantial cortical atrophy and plump gyri.   I like to increase the Alzheimer's medication- to 10 mg, and have him take a multivitamin and have OT.  04-20-2020, after neuropsychological testing . They had the neuro psych completed with Dr. Ane Payment, but have not heard about results yet.  We did not get results of these testing batteries. Carpal tunnel ? He has seen a Dr. Roberts Gaudy , MD, PhD,  orthopedist at [facility] - who wanted yet another NCV and EMG for this patient.  DR. Ramonita Lab had referred him.      12/10/2019 in a RV.  Wife is here in person, daughter is on the phone. The patient is defensive.  His MOCA was 23/ 30 , his handwriting is changed, his word output is reduced.  His MRI brain showed left sided atrophy, which involves  the word-finding part. He replaces some words with similar words, such as body temperature with pulse,   We are now repeating an NCV and EMG to help differentiate his dominant hand/ arm dysfunction from neck injury/ DDD or from CNS changes. Also referral to neuropsychology testing . Mental status / memory- short term memory loss. He had failed 4-5 years ago a memory test for long term care insurance.  He has progressed over the last 6 month , according to his RN daughter, [name].     10-29-2019 He was seen here as an URGENT  referral on  from Dr Apolonio Schneiders* for a neck-spine and abnormal brain MRI evaluation, while the family is concerned about a memory loss.  Chief concern according to patient : abnormal MRI of cervical spine is not families main concern.    I have the pleasure of seeing Alexander Cummings today, a right -handed White or Caucasian male with a Master's degree in accounting- MBA.  He  has a past medical history of Adhesive capsulitis of right shoulder (06/19/2019), BCC (basal cell carcinoma), Carpal tunnel syndrome, Kidney stones, Osteoarthritis, Recurrent upper respiratory infection (URI), and Tinea versicolor.   Family noted symptoms about a year ago, progressing over the last 23 month [name]- RN and daughter). Wife noted decrease in weight over 24 month, losing 20 pounds. Noted word finding problems, stumbling and stiffness, clumsiness, and cognitive impairment. "playing charades' at home trying to finish his sentences. Apraxia with common tasks  like how to use a credit card reader.   Changing of lanes when driving- wife bought a lane alarm.  Forgets turn signal.  Anosmia and ageusia after a fall from a ladder- 03/08/2016 MRI is in EPIC- no abnormalities.  New MRI images are not available- speaks of vast changes. .   Family medical  history: Mother died at age 64, late onset dementia. Father died at 55 Nov.1st 03/08/18.    Social history: Patient  retired from [employer] and  lives in a household with 2 persons. Family status is married , with adult children, grandchildren. One dog.   Tobacco use; never.  ETOH use : one beer a week. Caffeine intake in form of Coffee( 2 cups ) Soda( rare) Tea ( rare ) or energy drinks. Regular exercise in form of tennis, golf.  Walking the dog.  Hobbies : tennis.      Review of Systems: Out of a complete 14 system review, the patient complains of only the following symptoms, and all other reviewed systems are negative.: MMSE 28-30 here and 26-30 at Dr. Kenton Kingfisher.   Dr. Sherwood Gambler did Surgery Mar 09, 2011  to lumbar spine.He is now retired, right arm rigor, biceps cog wheeling , tremor. Loss of hand grip strength.  From 42 to 13 - grasping.   Had  NCV and EMG with Dr Nelva Bush. Delayed nerve conduction medial nerve right side, which is expected post cap rpal tunnel op.    Cold and weakness in right hand, tremor, affecting writing, golf and tennis game.    Social History   Socioeconomic History   Marital status: Married    Spouse name: Not on file   Number of children: Not on file   Years of education: Not on file   Highest education level: Not on file  Occupational History   Not on file  Tobacco Use   Smoking status: Never   Smokeless tobacco: Never  Substance and Sexual Activity   Alcohol use: Yes    Alcohol/week: 1.0 standard drink of alcohol    Types: 1 Cans of beer per week   Drug use: No   Sexual activity: Yes  Other Topics Concern   Not on file  Social History Narrative   Not on file   Social Determinants of Health   Financial Resource Strain: Not on file  Food Insecurity: Not on file  Transportation Needs: Not on file  Physical Activity: Not on file  Stress: Not on file  Social Connections: Not on file    Family History  Problem Relation Age of Onset   Pancreatic cancer Brother 54       dx'd 5 months before death   Dementia Mother     Past Medical History:  Diagnosis Date   Adhesive capsulitis of right  shoulder 06/19/2019   BCC (basal cell carcinoma)    face   Carpal tunnel syndrome    right wrist   Kidney stones    Osteoarthritis    back, knees, neck   Recurrent upper respiratory infection (URI)    gets colds infrequently...   Tinea versicolor     Past Surgical History:  Procedure Laterality Date   COLONOSCOPY     LUMBAR LAMINECTOMY/DECOMPRESSION MICRODISCECTOMY  10/03/2011   Procedure: LUMBAR LAMINECTOMY/DECOMPRESSION MICRODISCECTOMY;  Surgeon: Hosie Spangle;  Location: [facility];  Service: Neurosurgery;  Laterality: Left;  LEFT Lumbar four-five EXTRAFORAMINAL MICRODISKECTOMY   MOHS SURGERY  01/2014   right ctr  01/23/2019   TONSILLECTOMY     VASECTOMY       Current Outpatient Medications on File Prior to Visit  Medication Sig Dispense Refill   donepezil (ARICEPT) 10 MG tablet Take 1 tablet (10 mg total) by mouth at bedtime. 90 tablet 1   gabapentin (NEURONTIN) 300 MG capsule Take 300 mg by mouth daily.     memantine (NAMENDA) 10 MG tablet Take 1 tablet (10 mg total) by mouth 2 (two) times daily. (Patient taking differently: Take 10 mg by mouth 2 (two) times daily. Patient only takes once daily.) 180 tablet 3   mirtazapine (REMERON) 7.5 MG tablet Take 7.5 mg by mouth at bedtime.     No current facility-administered medications on file prior to visit.    No Known Allergies  Physical exam:  Today's Vitals   07/11/22 0844  BP: 111/62  Pulse: (!) 50  Weight: 118 lb (53.5 kg)  Height: '5\' 8"'$  (1.727 m)   Body mass index is 17.94 kg/m.   Wt Readings from Last 3 Encounters:  07/11/22 118 lb (53.5 kg)  12/08/21 127 lb (57.6 kg)  06/09/21 125 lb (56.7 kg)     Ht Readings from Last 3 Encounters:  07/11/22 '5\' 8"'$  (1.727 m)  12/08/21 '5\' 8"'$  (1.727 m)  06/09/21 '5\' 8"'$  (1.727 m)      General:  The patient is awake, alert and appears not in acute distress. The patient is well groomed. He is tanned.  Head: Normocephalic, atraumatic. Neck is supple. Mallampati 1- wide  open with retrognathia.,  neck circumference 15.5  inches . Nasal airflow patent.  Dental status: intact.   Cardiovascular:  Regular rate and cardiac rhythm by pulse,  without distended neck veins. Respiratory: Lungs are clear to auscultation.  Skin:  Without evidence of ankle edema, or rash. Trunk: The patient's posture is erect.    Neurologic exam : The patient is awake and alert, oriented to place and time.   Memory subjective described -  defensiveness-  Attention span & concentration ability appears abnormal, he is angry - about the test, about the test results not being here. Speech is fluent,  without  dysarthria, dysphonia or aphasia.  Mood and affect are slightly agitated.      07/11/2022    8:59 AM 12/08/2021    9:46 AM 06/09/2021   10:29 AM 12/08/2020   10:27 AM 04/20/2020    1:55 PM  Montreal Cognitive Assessment   Visuospatial/ Executive (0/5) '2 3 5 4 3  '$ Naming (0/3) '3 3 2 3 2  '$ Attention: Read list of digits (0/2) 0 0 '1 2 1  '$ Attention: Read list of letters (0/1) 0 '1 1 1 1  '$ Attention: Serial 7 subtraction starting at 100 (0/3) '2 3 1 1 3  '$ Language: Repeat phrase (0/2) 0 0 1 1 0  Language : Fluency (0/1) 0 0 0 1 0  Abstraction (0/2) '2 2 2 1 2  '$ Delayed Recall (0/5) 1 3 0 0 2  Orientation (0/6) '5 6 5 6 6  '$ Total '15 21 18 20 20      '$ 10/29/2019    9:25 AM  MMSE - Mini Mental State Exam  Orientation to time 5  Orientation to Place 5  Registration 3  Attention/ Calculation 4  Recall 3  Language- name 2 objects 2  Language- repeat 1  Language- follow 3 step command 3  Language- read & follow direction 1  Write a sentence 1  Copy design 0  Total score 28      Cranial nerves: no loss of smell or taste reported  Pupils are equal and briskly reactive to light. Funduscopic exam intact.  Extraocular movements in vertical and horizontal planes were intact and without nystagmus. No Diplopia. Visual fields by finger perimetry are intact. Hearing was intact to soft voice  and finger rubbing.    Facial sensation intact to fine touch.  Facial motor strength is symmetric - facial expression is " astonished " and tongue and uvula move midline.  Neck ROM :restriction, stooped- for age . The right shoulder shrug was symmetrical, but the right shoulder droops.     Motor exam:  loss of bulk, increased tone in right arm, flexed at elbow.  He has problems with dexterity, handwriting , drawing.  Waxy increased tone with cog wheeling, there is now rigor !!  asymmetric grip strength- reduced in both hands.  Sensory:  Fine touch and vibration were tested  and normal.  Proprioception tested in the upper extremities was normal.   Coordination: Rapid alternating movements in the fingers/hands were slowed The Finger-to-nose maneuver was intact without evidence of ataxia or tremor.   Gait and station: Patient could rise unassisted from a seated position, walked without assistive device. Increased width, normal arm swing, slightly stooped.  Stance is of normal width/ base and the patient turned with  4 steps.  Toe and heel walk were intact. Deep tendon reflexes: in the upper and lower extremities are symmetric and very brisk- crossed reflex reaction .Babinski response was deferred.      After spending a total time of  35 minutes face to face and additional time for physical and neurologic examination, review of laboratory studies,  personal review of imaging studies and discussion with the family in person and on the phone (daughter Colletta Maryland) , reports and results of other testing and review of referral information / records as far as provided in visit, I have established the following assessments:  We have no results form his  referral to neuropsychology testing .  Mental status / memory- short term memory loss. He had failed 4-5 years ago a memory test for long term care insurance.   He has progressed over the last 6 month , according to his daughter, [name],  RN.  I would like to thank Shirline Frees, MD and Dr. Apolonio Schneiders, MD,  for allowing me to meet with and to take care of this pleasant patient.  In short, DEMETRIAS GOODBAR is presenting with cognitive dysfunction, a symptom that can be attributed to dysfunction and atrophy in left brain hemisphere.  He has dementia - and progression.  I plan to follow up either personally if the neuro psychological results are made available- I do not think that Mr. Melander has insight in his condition and will likely overestimate his ability, and underestimate his cognitive impairment.  He speaks in incomplete sentences. He is to be referred to the memory clinic at [facility].      CC: I will share my notes with PCP/ orthopedist   Larey Seat, MD      Addendum from 11-04-2019- Dear Dr. Kenton Kingfisher and Dr. Apolonio Schneiders,  I was finally able to review the MRI from 15 October 2019.  This is a patient who has a history of right carpal tunnel surgery, fell from a ladder approximately 30 months ago which resulted in loss of sense of smell after head injury.  He continued to have a right upper extremity weakness numbness in the ulnar fingers and complains about dropping objects.  His cervical MRI was performed on 15 October 2019.  The interspace between C1 and 2 at the craniocervical junction was unremarkable as was C2-C3.  There is already moderate spinal canal stenosis noted with flattening of the cord between the third and fourth cervical vertebra and severe left uncovertebral osteophytosis with severe left-sided facet arthrosis.  The neural exit foramina are narrowed to a  more severe degree  on the left than on the right.  This continues to C3-C5 with moderate bulging, a narrowed canal, with moderate severe left-sided facet arthrosis moderate spinal canal stenosis severe left uncovertebral osteophytosis and neural foraminal narrowing.  C5 and C6 have the same findings moderate severe left uncovertebral osteophytosis at this  time severe left facet arthrosis but no significant spinal stenosis severe left-sided foraminal from foraminal narrowing here the findings on the right and milder than on the left.  There was no spinal canal stenosis noted between the sixth and seventh vertebra.  These findings of partially severe foraminal cervical stenosis and spinal stenosis could explain left-sided pain syndrome, hyperreflexia, left extremity weakness or dysesthesias. MRI brain of the same date without contrast showed asymmetric left parietal atrophy with sulci widening accordingly in distribution left over right.   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  There  was some verbal trouble - but also right hand and foot clumsiness. He has burning sensation and sometimes an involuntary movement of the hand.  He does have mainly left exit foraminal stenosis in Cervical neck , seen on MRI- but he presents more with apraxia.  There was no spinal canal stenosis noted between the sixth and seventh vertebra. I increased Aricept  to 10 mg from 5 mg -  He takes a bite of banana with it .    07-11-2022; Assessment and Plan   1) continues to report right hand ( dominant) coldness, pain and weakness.  Has 3 possible sources, spinal stenosis, radiculopathy , carpal tunnel, and brain atrophy with left dominance, affecting work finding and right dexterity.    2) Alzheimer's dementia was found to be likely the diagnosis by neuropsychological evaluation,  continue namenda and Aricept.  It is not what I see today-   I am more concerned about Cortico -basilar degeneration?  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   3) aphasia - hyperreflexia.   Rv in 6 months for MMSE. We can stop obtaining MOCAS now. Referral for another neuropsychological evaluation- apraxia, alien hand, .  I ordered a new MRI brain . I ordered a new NCV/ EMG for right arm with Dr Krista Blue.   Ophthalmology exam with visual fields.   Electronically signed by: Larey Seat, MD 07/11/2022 9:19 AM  [facility] Board certified by The [employer] of Sleep Medicine and Diplomate of the [employer] of Sleep Medicine. Board certified In Neurology through the [employer], Fellow of the [employer] of Neurology. Medical Director of [employer].

## 2022-07-11 NOTE — Patient Instructions (Signed)
Memantine Tablets What is this medication? MEMANTINE (MEM an teen) treats memory loss and confusion (dementia) in people who have Alzheimer disease. It works by improving attention, memory, and the ability to engage in daily activities. It is not a cure for dementia or Alzheimer disease. This medicine may be used for other purposes; ask your health care provider or pharmacist if you have questions. COMMON BRAND NAME(S): Namenda What should I tell my care team before I take this medication? They need to know if you have any of these conditions: Kidney disease Liver disease Seizures Trouble passing urine An unusual or allergic reaction to memantine, other medications, foods, dyes, or preservatives Pregnant or trying to get pregnant Breast-feeding How should I use this medication? Take this medication by mouth with water. Follow the directions on the prescription label. You may take this medication with or without food. Take your doses at regular intervals. Do not take your medication more often than directed. Continue to take your medication even if you feel better. Do not stop taking except on the advice of your care team. Talk to your care team about the use of this medication in children. Special care may be needed Overdosage: If you think you have taken too much of this medicine contact a poison control center or emergency room at once. NOTE: This medicine is only for you. Do not share this medicine with others. What if I miss a dose? If you miss a dose, take it as soon as you can. If it is almost time for your next dose, take only that dose. Do not take double or extra doses. If you do not take your medication for several days, contact your care team. Your dose may need to be changed. What may interact with this medication? Acetazolamide Amantadine Cimetidine Dextromethorphan Dofetilide Hydrochlorothiazide Ketamine Metformin Methazolamide Quinidine Ranitidine Sodium  bicarbonate Triamterene This list may not describe all possible interactions. Give your health care provider a list of all the medicines, herbs, non-prescription drugs, or dietary supplements you use. Also tell them if you smoke, drink alcohol, or use illegal drugs. Some items may interact with your medicine. What should I watch for while using this medication? Visit your care team for regular checks on your progress. Check with your care team if there is no improvement in your symptoms or if they get worse. This medication may affect your coordination, reaction time, or judgment. Do not drive or operate machinery until you know how this medication affects you. Sit up or stand slowly to reduce the risk of dizzy or fainting spells. Drinking alcohol with this medication can increase the risk of these side effects. What side effects may I notice from receiving this medication? Side effects that you should report to your care team as soon as possible: Allergic reactions--skin rash, itching, hives, swelling of the face, lips, tongue, or throat Side effects that usually do not require medical attention (report to your care team if they continue or are bothersome): Confusion Constipation Diarrhea Dizziness Headache This list may not describe all possible side effects. Call your doctor for medical advice about side effects. You may report side effects to FDA at 1-800-FDA-1088. Where should I keep my medication? Keep out of the reach of children. Store at room temperature between 15 degrees and 30 degrees C (59 degrees and 86 degrees F). Throw away any unused medication after the expiration date. NOTE: This sheet is a summary. It may not cover all possible information. If you have questions about  this medicine, talk to your doctor, pharmacist, or health care provider.  2023 Elsevier/Gold Standard (2021-12-07 00:00:00)

## 2022-07-12 ENCOUNTER — Telehealth: Payer: Self-pay | Admitting: Neurology

## 2022-07-12 DIAGNOSIS — M79641 Pain in right hand: Secondary | ICD-10-CM | POA: Diagnosis not present

## 2022-07-12 LAB — COMPREHENSIVE METABOLIC PANEL
ALT: 11 IU/L (ref 0–44)
AST: 16 IU/L (ref 0–40)
Albumin/Globulin Ratio: 2.3 — ABNORMAL HIGH (ref 1.2–2.2)
Albumin: 4.3 g/dL (ref 3.8–4.8)
Alkaline Phosphatase: 77 IU/L (ref 44–121)
BUN/Creatinine Ratio: 23 (ref 10–24)
BUN: 17 mg/dL (ref 8–27)
Bilirubin Total: 0.9 mg/dL (ref 0.0–1.2)
CO2: 27 mmol/L (ref 20–29)
Calcium: 10.2 mg/dL (ref 8.6–10.2)
Chloride: 103 mmol/L (ref 96–106)
Creatinine, Ser: 0.74 mg/dL — ABNORMAL LOW (ref 0.76–1.27)
Globulin, Total: 1.9 g/dL (ref 1.5–4.5)
Glucose: 110 mg/dL — ABNORMAL HIGH (ref 70–99)
Potassium: 4.2 mmol/L (ref 3.5–5.2)
Sodium: 142 mmol/L (ref 134–144)
Total Protein: 6.2 g/dL (ref 6.0–8.5)
eGFR: 96 mL/min/{1.73_m2} (ref >59)

## 2022-07-12 NOTE — Progress Notes (Signed)
CMET allows for  use of contrast in planned imaging.

## 2022-07-12 NOTE — Telephone Encounter (Signed)
HTA NRP sent to [facility] to schedule

## 2022-07-12 NOTE — Telephone Encounter (Signed)
HTA order sent to GI, NPR they will reach out to the patient to schedule.  

## 2022-07-12 NOTE — Telephone Encounter (Signed)
Referral sent to [facility] [phone]

## 2022-07-13 ENCOUNTER — Encounter: Payer: Self-pay | Admitting: Neurology

## 2022-07-14 DIAGNOSIS — M79641 Pain in right hand: Secondary | ICD-10-CM | POA: Diagnosis not present

## 2022-07-19 DIAGNOSIS — M79641 Pain in right hand: Secondary | ICD-10-CM | POA: Diagnosis not present

## 2022-07-20 ENCOUNTER — Ambulatory Visit
Admission: RE | Admit: 2022-07-20 | Discharge: 2022-07-20 | Disposition: A | Discharge status: Home / Self Care | Payer: PPO | Source: Ambulatory Visit | Attending: Neurology | Admitting: Neurology

## 2022-07-20 DIAGNOSIS — R292 Abnormal reflex: Secondary | ICD-10-CM

## 2022-07-20 DIAGNOSIS — R4701 Aphasia: Secondary | ICD-10-CM | POA: Diagnosis not present

## 2022-07-20 DIAGNOSIS — G249 Dystonia, unspecified: Secondary | ICD-10-CM

## 2022-07-20 MED ORDER — GADOBENATE DIMEGLUMINE 529 MG/ML IV SOLN
10.0000 mL | Freq: Once | INTRAVENOUS | Status: AC | PRN
  Administered 2022-07-20: 10 mL via INTRAVENOUS

## 2022-07-21 DIAGNOSIS — M79641 Pain in right hand: Secondary | ICD-10-CM | POA: Diagnosis not present

## 2022-07-22 ENCOUNTER — Encounter (HOSPITAL_COMMUNITY)
Admission: RE | Admit: 2022-07-22 | Discharge: 2022-07-22 | Disposition: A | Discharge status: Home / Self Care | Payer: PPO | Source: Ambulatory Visit | Attending: Neurology | Admitting: Neurology

## 2022-07-22 DIAGNOSIS — G309 Alzheimer's disease, unspecified: Secondary | ICD-10-CM | POA: Insufficient documentation

## 2022-07-22 DIAGNOSIS — F03B18 Unspecified dementia, moderate, with other behavioral disturbance: Secondary | ICD-10-CM | POA: Diagnosis not present

## 2022-07-22 DIAGNOSIS — F0390 Unspecified dementia without behavioral disturbance: Secondary | ICD-10-CM | POA: Diagnosis not present

## 2022-07-22 LAB — GLUCOSE, CAPILLARY: Glucose-Capillary: 107 mg/dL — ABNORMAL HIGH (ref 70–99)

## 2022-07-22 MED ORDER — FLUDEOXYGLUCOSE F - 18 (FDG) INJECTION
10.0000 | Freq: Once | INTRAVENOUS | Status: AC
  Administered 2022-07-22: 9.92 via INTRAVENOUS

## 2022-07-25 NOTE — Addendum Note (Signed)
Addended by: Larey Seat on: 07/25/2022 11:34 AM   Modules accepted: Orders

## 2022-07-25 NOTE — Progress Notes (Signed)
Yes, this would look a lot more like Lewy body disease or posterior atrophy dementia-   Awaiting visual and neuropsychologic testing.

## 2022-07-25 NOTE — Progress Notes (Signed)
IMPRESSION:   MRI brain with and without contrast demonstrating: - Moderate perisylvian and mesial temporal atrophy.  Mild ventriculomegaly on ex vacuo basis.  Findings slightly progressed since 2020.    INTERPRETING PHYSICIAN:  Penni Bombard, MD  See follow up tests pending - visual testing, neuropsychological battery and PET

## 2022-07-26 DIAGNOSIS — M79641 Pain in right hand: Secondary | ICD-10-CM | POA: Diagnosis not present

## 2022-07-28 DIAGNOSIS — M79641 Pain in right hand: Secondary | ICD-10-CM | POA: Diagnosis not present

## 2022-08-02 DIAGNOSIS — M79641 Pain in right hand: Secondary | ICD-10-CM | POA: Diagnosis not present

## 2022-08-04 ENCOUNTER — Telehealth: Payer: Self-pay | Admitting: Neurology

## 2022-08-04 DIAGNOSIS — M79641 Pain in right hand: Secondary | ICD-10-CM | POA: Diagnosis not present

## 2022-08-04 NOTE — Telephone Encounter (Signed)
-----   Message from Enid Derry sent at 08/04/2022  2:46 PM EDT ----- I just called again that number , there is no answer and VM was left again . We are waiting to receive phone call from patient Thank you ! ----- Message ----- From: Darleen Crocker, RN Sent: 07/26/2022   7:39 AM EDT To: Enid Derry  Feb 11, 2048 Hey there, I am trying to help out our referral people and so I hope I am doing this correctly. I notice in the referral notes it mentioned that Mr. Nofziger was accepted and a VM was left. The wife states that neither her or the pt have VM's and wanted me to reach out to see if someone can try her cell phone. Here is her message below. I would also be more than happy to pass along the phone number for her to call if you want to provide that to me.  "Can you please follow up with someone and have them call my cell phone number [phone]?"   Thank you Marin Olp

## 2022-08-05 ENCOUNTER — Encounter: Payer: Self-pay | Admitting: Psychology

## 2022-08-09 DIAGNOSIS — M79641 Pain in right hand: Secondary | ICD-10-CM | POA: Diagnosis not present

## 2022-08-16 DIAGNOSIS — M79641 Pain in right hand: Secondary | ICD-10-CM | POA: Diagnosis not present

## 2022-08-18 DIAGNOSIS — M79641 Pain in right hand: Secondary | ICD-10-CM | POA: Diagnosis not present

## 2022-08-30 DIAGNOSIS — M79641 Pain in right hand: Secondary | ICD-10-CM | POA: Diagnosis not present

## 2022-08-31 ENCOUNTER — Ambulatory Visit: Payer: PPO | Admitting: Neurology

## 2022-08-31 ENCOUNTER — Ambulatory Visit (INDEPENDENT_AMBULATORY_CARE_PROVIDER_SITE_OTHER): Payer: PPO | Admitting: Neurology

## 2022-08-31 VITALS — BP 114/52 | HR 47 | Ht 68.0 in | Wt 115.8 lb

## 2022-08-31 DIAGNOSIS — R4701 Aphasia: Secondary | ICD-10-CM | POA: Diagnosis not present

## 2022-08-31 DIAGNOSIS — G249 Dystonia, unspecified: Secondary | ICD-10-CM

## 2022-08-31 DIAGNOSIS — G3109 Other frontotemporal dementia: Secondary | ICD-10-CM

## 2022-08-31 DIAGNOSIS — R292 Abnormal reflex: Secondary | ICD-10-CM

## 2022-08-31 DIAGNOSIS — R9089 Other abnormal findings on diagnostic imaging of central nervous system: Secondary | ICD-10-CM

## 2022-08-31 DIAGNOSIS — G309 Alzheimer's disease, unspecified: Secondary | ICD-10-CM

## 2022-08-31 DIAGNOSIS — R482 Apraxia: Secondary | ICD-10-CM

## 2022-08-31 NOTE — Progress Notes (Signed)
ASSESSMENT AND PLAN  Alexander Cummings is a 76 y.o. male    Dementia Posturing of the right side,  MRI of the brain showed significant perisylvian fissure atrophy, more on the left side, which is in line with his symptoms of word finding difficulties, right arm difficulties, not typical for Alzheimer's disease, differentiation diagnoses also including frontotemporal dementia  EMG nerve conduction study showed normal evidence of peripheral neuropathy, or intrinsic muscle disease,    DIAGNOSTIC DATA (LABS, IMAGING, TESTING) - I reviewed patient records, labs, notes, testing and imaging myself where available.   MEDICAL HISTORY:  Alexander Cummings is a 76 year old male, accompanied by his wife, referred by Dr. Brett Fairy for electrodiagnostic study to rule out underlying neuromuscular disease for his weight loss, muscle wasting.  I reviewed and summarized the referring note. PMHX. Dementia Depression, Hx of kidney stone.  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18, around age 39  in 2023, he was noted to have gradual onset of memory loss, especially word finding difficulties, gradually getting worse, now he has increased difficulty with slow reaction time, carry on normal speech conversation, there was no significant personality change, her mother had memory loss in her 94s  In addition he has some decreased appetite, over the last 3 years, he has lost 30 pounds, he denies significant gait abnormality, but moves slower, has developed a tendency to hold right arm in elbow flexion  Personally reviewed MRI September 19, 2022, moderate perisylvian and mesial temporal lobe atrophy, most prominent on the left side, mild ventriculomegaly on ex vacuo basis, there is mild progression compared to previous scan in 2020  EMG nerve conduction study today showed no evidence of peripheral nerve disease, or intrinsic muscle disease   PHYSICAL EXAM: Blood pressure 114/52 heart rate of  47  PHYSICAL EXAMNIATION:  Gen: NAD, conversant, well nourised, well groomed                     Cardiovascular: Regular rate rhythm, no peripheral edema, warm, nontender. Eyes: Conjunctivae clear without exudates or hemorrhage Neck: Supple, no carotid bruits. Pulmonary: Clear to auscultation bilaterally   NEUROLOGICAL EXAM:  MENTAL STATUS: Speech/cognition: Awake, alert, slow speech, struggle to find the right word, mild slowing in repeating, but still able to repeat sentence correctly with effort CRANIAL NERVES: CN II: Visual fields are full to confrontation. Pupils are round equal and briskly reactive to light. CN III, IV, VI:  No ptosis.  Smooth pursuit was broken to small catch up saccade CN V: Facial sensation is intact to light touch CN VII: Face is symmetric with normal eye closure  CN VIII: Hearing is normal to causal conversation. CN IX, X: Phonation is normal. CN XI: Head turning and shoulder shrug are intact  MOTOR: tendency to hold right arm in elbow flexion, fixation of right arm on rapid rotating movement, pronation drift,  REFLEXES: Reflexes are 2+ and hyperreflexia on the right arm  SENSORY: Intact to light touch, pinprick and vibratory sensation are intact in fingers and toes.  COORDINATION: There is no trunk or limb dysmetria noted.  GAIT/STANCE:  steady, cautious  REVIEW OF SYSTEMS:  Full 14 system review of systems performed and notable only for as above All other review of systems were negative.   ALLERGIES: No Known Allergies  HOME MEDICATIONS: Current Outpatient Medications  Medication Sig Dispense Refill   donepezil (ARICEPT) 10 MG tablet Take 1 tablet (10 mg total) by mouth at  bedtime. 90 tablet 1   gabapentin (NEURONTIN) 300 MG capsule Take 300 mg by mouth daily.     memantine (NAMENDA XR) 21 MG CP24 24 hr capsule Take 1 capsule (21 mg total) by mouth daily. 30 capsule 5   mirtazapine (REMERON) 7.5 MG tablet Take 7.5 mg by mouth at  bedtime.     No current facility-administered medications for this visit.    PAST MEDICAL HISTORY: Past Medical History:  Diagnosis Date   Adhesive capsulitis of right shoulder 06/19/2019   BCC (basal cell carcinoma)    face   Carpal tunnel syndrome    right wrist   Kidney stones    Osteoarthritis    back, knees, neck   Recurrent upper respiratory infection (URI)    gets colds infrequently...   Tinea versicolor     PAST SURGICAL HISTORY: Past Surgical History:  Procedure Laterality Date   COLONOSCOPY     LUMBAR LAMINECTOMY/DECOMPRESSION MICRODISCECTOMY  10/03/2011   Procedure: LUMBAR LAMINECTOMY/DECOMPRESSION MICRODISCECTOMY;  Surgeon: Hosie Spangle;  Location: [facility];  Service: Neurosurgery;  Laterality: Left;  LEFT Lumbar four-five EXTRAFORAMINAL MICRODISKECTOMY   MOHS SURGERY  01/2014   right ctr  01/23/2019   TONSILLECTOMY     VASECTOMY      FAMILY HISTORY: Family History  Problem Relation Age of Onset   Pancreatic cancer Brother 83       dx'd 5 months before death   Dementia Mother     SOCIAL HISTORY: Social History   Socioeconomic History   Marital status: Married    Spouse name: Not on file   Number of children: Not on file   Years of education: Not on file   Highest education level: Not on file  Occupational History   Not on file  Tobacco Use   Smoking status: Never   Smokeless tobacco: Never  Substance and Sexual Activity   Alcohol use: Yes    Alcohol/week: 1.0 standard drink of alcohol    Types: 1 Cans of beer per week   Drug use: No   Sexual activity: Yes  Other Topics Concern   Not on file  Social History Narrative   Not on file   Social Determinants of Health   Financial Resource Strain: Not on file  Food Insecurity: Not on file  Transportation Needs: Not on file  Physical Activity: Not on file  Stress: Not on file  Social Connections: Not on file  Intimate Partner Violence: Not on file      Marcial Pacas, M.D.  Ph.D.  [facility] [address] [place], [place] [address] Ph: [phone] Fax: [phone]  CC:  Shirline Frees, MD [address] [place],  [place] [address]  Shirline Frees, MD

## 2022-09-01 DIAGNOSIS — M79641 Pain in right hand: Secondary | ICD-10-CM | POA: Diagnosis not present

## 2022-09-02 NOTE — Progress Notes (Signed)
EMG report is under procedure 

## 2022-09-02 NOTE — Procedures (Signed)
Full Name: Cody Albus Gender: Male MRN #: [id number] Date of Birth: October 19, 1946    Visit Date: 08/31/2022 10:22 Age: 76 Years Examining Physician: Marcial Pacas Referring Physician: Marcial Pacas Height: 5 feet 8 inch History: 76 year old male, with history of rapid weight loss, in the setting of dementia, language difficulties, right hand posturing,  Summary of the test: Nerve conduction study: Bilateral median and ulnar sensory response showed slightly prolonged peak latency with preserved snap amplitude in the setting of cold limb temperature.  Bilateral median and right ulnar motor responses showed mildly prolonged distal latency with normal CMAP amplitude, conduction velocity.  Electromyography: Selected needle examinations of right upper extremity muscles showed no significant abnormality.  Conclusion: This is essentially a normal study.  Mild prolonged distal latency on multiple upper extremity sensorimotor studies could be due to cold limb temperature.    Catalina Pizza.D.Ph.D.  [facility] [address] [place], [place] [address] Tel: [phone] Fax: [phone]  Verbal informed consent was obtained from the patient, patient was informed of potential risk of procedure, including bruising, bleeding, hematoma formation, infection, muscle weakness, muscle pain, numbness, among others.        Jansen    Nerve / Sites Muscle Latency Ref. Amplitude Ref. Rel Amp Segments Distance Velocity Ref. Area    ms ms mV mV %  cm m/s m/s mVms  R Median - APB     Wrist APB 4.5 ?4.4 6.4 ?4.0 100 Wrist - APB 7   27.7     Upper arm APB 9.0  5.6  87.8 Upper arm - Wrist 23 51 ?49 26.5  L Median - APB     Wrist APB 4.5 ?4.4 3.7 ?4.0 100 Wrist - APB 7   19.1     Upper arm APB 8.9  4.4  120 Upper arm - Wrist 23 52 ?49 21.6  R Ulnar - ADM     Wrist ADM 3.6 ?3.3 9.8 ?6.0 100 Wrist - ADM 7   39.3     B.Elbow ADM 6.3  9.3  94.8 B.Elbow - Wrist 16 60 ?49 38.0      A.Elbow ADM 8.8  8.7  93.3 A.Elbow - B.Elbow 13 50 ?49 36.0           SNC    Nerve / Sites Rec. Site Peak Lat Ref.  Amp Ref. Segments Distance    ms ms V V  cm  R Median - Orthodromic (Dig II, Mid palm)     Dig II Wrist 3.6 ?3.4 6 ?10 Dig II - Wrist 13  L Median - Orthodromic (Dig II, Mid palm)     Dig II Wrist 3.5 ?3.4 10 ?10 Dig II - Wrist 13  R Ulnar - Orthodromic, (Dig V, Mid palm)     Dig V Wrist 3.4 ?3.1 14 ?5 Dig V - Wrist 11  L Ulnar - Orthodromic, (Dig V, Mid palm)     Dig V Wrist 3.5 ?3.1 6 ?5 Dig V - Wrist 53             F  Wave    Nerve F Lat Ref.   ms ms  R Ulnar - ADM 30.4 ?32.0       EMG Summary Table    Spontaneous MUAP Recruitment  Muscle IA Fib PSW Fasc Other Amp Dur. Poly Pattern  R. First dorsal interosseous Normal None None None _______ Normal Normal Normal Normal  R. Pronator teres  Normal None None None _______ Normal Normal Normal Normal  R. Biceps brachii Normal None None None _______ Normal Normal Normal Normal  R. Deltoid Normal None None None _______ Normal Normal Normal Normal  R. Triceps brachii Normal None None None _______ Normal Normal Normal Normal  R. Extensor digitorum communis Normal None None None _______ Normal Normal Normal Normal

## 2022-09-06 ENCOUNTER — Encounter: Payer: Self-pay | Admitting: Neurology

## 2022-09-06 DIAGNOSIS — M79641 Pain in right hand: Secondary | ICD-10-CM | POA: Diagnosis not present

## 2022-09-08 DIAGNOSIS — M79641 Pain in right hand: Secondary | ICD-10-CM | POA: Diagnosis not present

## 2022-09-13 DIAGNOSIS — M79641 Pain in right hand: Secondary | ICD-10-CM | POA: Diagnosis not present

## 2022-09-22 DIAGNOSIS — M79641 Pain in right hand: Secondary | ICD-10-CM | POA: Diagnosis not present

## 2022-09-27 ENCOUNTER — Other Ambulatory Visit: Payer: Self-pay | Admitting: Neurology

## 2022-09-28 DIAGNOSIS — M79641 Pain in right hand: Secondary | ICD-10-CM | POA: Diagnosis not present

## 2022-10-11 DIAGNOSIS — M79641 Pain in right hand: Secondary | ICD-10-CM | POA: Diagnosis not present

## 2022-10-13 DIAGNOSIS — M79641 Pain in right hand: Secondary | ICD-10-CM | POA: Diagnosis not present

## 2022-10-18 ENCOUNTER — Ambulatory Visit: Payer: PPO | Admitting: Neurology

## 2022-10-18 DIAGNOSIS — H43813 Vitreous degeneration, bilateral: Secondary | ICD-10-CM | POA: Diagnosis not present

## 2022-10-18 DIAGNOSIS — M79641 Pain in right hand: Secondary | ICD-10-CM | POA: Diagnosis not present

## 2022-10-27 DIAGNOSIS — M79641 Pain in right hand: Secondary | ICD-10-CM | POA: Diagnosis not present

## 2022-11-01 DIAGNOSIS — M79641 Pain in right hand: Secondary | ICD-10-CM | POA: Diagnosis not present

## 2022-11-03 DIAGNOSIS — M79641 Pain in right hand: Secondary | ICD-10-CM | POA: Diagnosis not present

## 2022-11-08 DIAGNOSIS — M79669 Pain in unspecified lower leg: Secondary | ICD-10-CM | POA: Diagnosis not present

## 2022-11-08 DIAGNOSIS — M542 Cervicalgia: Secondary | ICD-10-CM | POA: Diagnosis not present

## 2022-11-10 DIAGNOSIS — M79669 Pain in unspecified lower leg: Secondary | ICD-10-CM | POA: Diagnosis not present

## 2022-11-10 DIAGNOSIS — F0390 Unspecified dementia without behavioral disturbance: Secondary | ICD-10-CM | POA: Diagnosis not present

## 2022-11-10 DIAGNOSIS — M542 Cervicalgia: Secondary | ICD-10-CM | POA: Diagnosis not present

## 2022-11-10 DIAGNOSIS — F5101 Primary insomnia: Secondary | ICD-10-CM | POA: Diagnosis not present

## 2022-11-10 DIAGNOSIS — E7800 Pure hypercholesterolemia, unspecified: Secondary | ICD-10-CM | POA: Diagnosis not present

## 2022-11-10 DIAGNOSIS — Z0000 Encounter for general adult medical examination without abnormal findings: Secondary | ICD-10-CM | POA: Diagnosis not present

## 2022-11-10 DIAGNOSIS — M4802 Spinal stenosis, cervical region: Secondary | ICD-10-CM | POA: Diagnosis not present

## 2022-11-29 DIAGNOSIS — M542 Cervicalgia: Secondary | ICD-10-CM | POA: Diagnosis not present

## 2022-11-29 DIAGNOSIS — M79669 Pain in unspecified lower leg: Secondary | ICD-10-CM | POA: Diagnosis not present

## 2022-12-01 DIAGNOSIS — M79669 Pain in unspecified lower leg: Secondary | ICD-10-CM | POA: Diagnosis not present

## 2022-12-01 DIAGNOSIS — M542 Cervicalgia: Secondary | ICD-10-CM | POA: Diagnosis not present

## 2022-12-06 DIAGNOSIS — M542 Cervicalgia: Secondary | ICD-10-CM | POA: Diagnosis not present

## 2022-12-06 DIAGNOSIS — M79669 Pain in unspecified lower leg: Secondary | ICD-10-CM | POA: Diagnosis not present

## 2022-12-08 DIAGNOSIS — M542 Cervicalgia: Secondary | ICD-10-CM | POA: Diagnosis not present

## 2022-12-08 DIAGNOSIS — M79669 Pain in unspecified lower leg: Secondary | ICD-10-CM | POA: Diagnosis not present

## 2022-12-09 ENCOUNTER — Other Ambulatory Visit: Payer: Self-pay | Admitting: Neurology

## 2022-12-13 ENCOUNTER — Ambulatory Visit: Payer: PPO | Admitting: Adult Health

## 2022-12-13 DIAGNOSIS — M542 Cervicalgia: Secondary | ICD-10-CM | POA: Diagnosis not present

## 2022-12-13 DIAGNOSIS — M79669 Pain in unspecified lower leg: Secondary | ICD-10-CM | POA: Diagnosis not present

## 2022-12-15 DIAGNOSIS — M542 Cervicalgia: Secondary | ICD-10-CM | POA: Diagnosis not present

## 2022-12-15 DIAGNOSIS — M79669 Pain in unspecified lower leg: Secondary | ICD-10-CM | POA: Diagnosis not present

## 2022-12-20 ENCOUNTER — Ambulatory Visit: Payer: PPO | Admitting: Neurology

## 2022-12-21 DIAGNOSIS — M542 Cervicalgia: Secondary | ICD-10-CM | POA: Diagnosis not present

## 2022-12-21 DIAGNOSIS — M79669 Pain in unspecified lower leg: Secondary | ICD-10-CM | POA: Diagnosis not present

## 2022-12-22 DIAGNOSIS — M542 Cervicalgia: Secondary | ICD-10-CM | POA: Diagnosis not present

## 2022-12-22 DIAGNOSIS — M79669 Pain in unspecified lower leg: Secondary | ICD-10-CM | POA: Diagnosis not present

## 2022-12-27 DIAGNOSIS — M79669 Pain in unspecified lower leg: Secondary | ICD-10-CM | POA: Diagnosis not present

## 2022-12-27 DIAGNOSIS — M542 Cervicalgia: Secondary | ICD-10-CM | POA: Diagnosis not present

## 2023-01-02 ENCOUNTER — Other Ambulatory Visit: Payer: Self-pay | Admitting: Neurology

## 2023-01-03 DIAGNOSIS — M79669 Pain in unspecified lower leg: Secondary | ICD-10-CM | POA: Diagnosis not present

## 2023-01-03 DIAGNOSIS — M542 Cervicalgia: Secondary | ICD-10-CM | POA: Diagnosis not present

## 2023-01-05 DIAGNOSIS — M542 Cervicalgia: Secondary | ICD-10-CM | POA: Diagnosis not present

## 2023-01-05 DIAGNOSIS — M79669 Pain in unspecified lower leg: Secondary | ICD-10-CM | POA: Diagnosis not present

## 2023-01-10 DIAGNOSIS — M79669 Pain in unspecified lower leg: Secondary | ICD-10-CM | POA: Diagnosis not present

## 2023-01-10 DIAGNOSIS — M542 Cervicalgia: Secondary | ICD-10-CM | POA: Diagnosis not present

## 2023-01-12 DIAGNOSIS — M79669 Pain in unspecified lower leg: Secondary | ICD-10-CM | POA: Diagnosis not present

## 2023-01-12 DIAGNOSIS — M542 Cervicalgia: Secondary | ICD-10-CM | POA: Diagnosis not present

## 2023-01-17 DIAGNOSIS — M79669 Pain in unspecified lower leg: Secondary | ICD-10-CM | POA: Diagnosis not present

## 2023-01-17 DIAGNOSIS — M542 Cervicalgia: Secondary | ICD-10-CM | POA: Diagnosis not present

## 2023-01-19 DIAGNOSIS — M79669 Pain in unspecified lower leg: Secondary | ICD-10-CM | POA: Diagnosis not present

## 2023-01-19 DIAGNOSIS — M542 Cervicalgia: Secondary | ICD-10-CM | POA: Diagnosis not present

## 2023-01-24 DIAGNOSIS — M79669 Pain in unspecified lower leg: Secondary | ICD-10-CM | POA: Diagnosis not present

## 2023-01-24 DIAGNOSIS — M542 Cervicalgia: Secondary | ICD-10-CM | POA: Diagnosis not present

## 2023-01-31 DIAGNOSIS — M542 Cervicalgia: Secondary | ICD-10-CM | POA: Diagnosis not present

## 2023-01-31 DIAGNOSIS — M79669 Pain in unspecified lower leg: Secondary | ICD-10-CM | POA: Diagnosis not present

## 2023-02-02 DIAGNOSIS — M542 Cervicalgia: Secondary | ICD-10-CM | POA: Diagnosis not present

## 2023-02-02 DIAGNOSIS — M79669 Pain in unspecified lower leg: Secondary | ICD-10-CM | POA: Diagnosis not present

## 2023-02-14 DIAGNOSIS — M542 Cervicalgia: Secondary | ICD-10-CM | POA: Diagnosis not present

## 2023-02-14 DIAGNOSIS — M79669 Pain in unspecified lower leg: Secondary | ICD-10-CM | POA: Diagnosis not present

## 2023-02-16 ENCOUNTER — Encounter: Payer: PPO | Attending: Psychology | Admitting: Psychology

## 2023-02-16 DIAGNOSIS — R482 Apraxia: Secondary | ICD-10-CM | POA: Diagnosis not present

## 2023-02-16 DIAGNOSIS — G249 Dystonia, unspecified: Secondary | ICD-10-CM | POA: Diagnosis not present

## 2023-02-16 DIAGNOSIS — R4189 Other symptoms and signs involving cognitive functions and awareness: Secondary | ICD-10-CM | POA: Diagnosis not present

## 2023-02-16 DIAGNOSIS — R4701 Aphasia: Secondary | ICD-10-CM | POA: Insufficient documentation

## 2023-02-16 NOTE — Progress Notes (Signed)
Neuropsychological Consultation   Patient:   Alexander Cummings   DOB:   05/16/1946  MR Number:  [id number]  Location:  [facility] [place] [facility] [place], [place] [id number] [facility] [address] Dept: [phone]           Date of Service:   02/16/2023  Location of Service and Individuals present: Evaluation took place in my outpatient clinic office with the patient and his wife present along with myself.  Start Time:   8 AM End Time:   9 AM  Patient Consent and Confidentiality: Reviewed consent and confidentiality issues with noticed provided to patient and his wife regarding the purpose of the neuropsychological evaluation and limits to confidentiality including report be made available in the patient's EMR and provided to referring physician and the need to review patient's complete medical records as part of the evaluation.  Consent for Evaluation and Treatment:  Signed:  Yes Explanation of Privacy Policies:  Signed:  Yes Discussion of Confidentiality Limits:  Yes  Provider/Observer:  Ilean Skill, Psy.D.       Clinical Neuropsychologist       Billing Code/Service: [phone]  Chief Complaint:    No chief complaint on file.   Reason for Service:    [name] is a 77 year old male referred for neuropsychological evaluation due to ongoing concerns about potential degenerative dementia with considerations including Alzheimer's dementia and Lewy body dementia with previous neuropsychological testing along with other imaging and neurological evaluations.  Patient has continued to take Namenda and Aricept without apparent side effects.  Patient has a past medical history of cervical radiculopathy with pain and weakness in the right hand.  Patient has had extensive PT for his right hand/arm.  Patient uses left hand almost exclusively.  Patient has also had previous carpal tunnel surgery.   Patient has abnormal electrodiagnostic study of right wrist.  Patient initially presented to neurology after referral from Gavin Pound, MD with symptoms including word finding difficulties, stumbling and stiffness, clumsiness, and cognitive impairments.  Approxihas been noted and description of short-term memory loss.  Why have not been able to see the actual results of the neuropsychological evaluation done in April 2021 there are documentations of this evaluation briefly and Dr. Edwena Felty previous notes.  Dr. Brett Fairy suggest that the testing indicates Alzheimer's type dementia process and correlates with substantial cortical atrophy noted on previous MRIs.  The patient has continued to lose weight and losing overall muscle mass.  He has progressively began to look frail when he had been quite physically active including an active tennis player.  Patient is identified and displayed tremor and decreasing performance on memory test screening.  There have been more concerns about frontal lobe involvement in his progressive dementia with the presentation of increasing worsening of symptoms and addition of symptoms.  During today's face-to-face clinical interview the patient and his wife were both present.  When reviewing timeline and history of presentation of symptoms the patient first began having significant issues with his right arm approximately 6 years ago.  However, at that time he had had a physical injury while playing tennis and is right arm and gotten entangled in a court dividing that and was essentially suspended off the ground for period of time.  He suffered an arm injury but continued to have trouble with that arm including having carpal tunnel symptoms and was seen by neurosurgery.  The patient, however, has progressively  worsened with his right arm and now has lost most control over his right arm.  Previous EMG studies have not been able to identify deficits to the point for peripheral nerve  function to explain the degree of motor control loss in his right arm.  Patient and wife report that 4 years ago they began noting more changes in his overall gait and the fact that the patient is now using his left hand almost exclusively.  Tremors were noted to have begun roughly 4 to 5 years ago even when at the time he was rather healthy overall.  The patient denies any significant pain.  The patient is lost roughly 40 pounds of weight over the past several years without purposeful attempts at weight loss.  He is now quite thin and frail.  The patient is having increasing difficulties with comprehension and understanding, executive functioning, visual-spatial awareness, word finding and word recall, expressive language changes, short-term memory difficulties and periods of confusion.  He is having increased difficulties managing ADLs particularly due to motor functioning and is overall weak and tends to be repetitive in thoughts and statements.  Essentially symptoms began to present roughly 4 years ago with the patient having MRI, PET scan and previous neuropsychological testing consistent with left brain atrophy, which is also consistent with right arm motor functioning and expressive language changes.  These changes are described as a gradual decline in capabilities that has been rather steady and change.  One of the biggest change has been changes in his expressive language with significant difficulties with word finding.  Consistencies of her day-to-day functioning are typical although when he has a change of scenery and is away from his home he may have significant worsening of symptoms.  The patient also may have times where dehydration is playing a role in worsening of symptoms.  Primary medications include Namenda and Aricept.  Patient and wife both deny any visual hallucinations or visual disturbance.  There are significant gait changes but no particular changes in geographic orientation.  The patient  is still walking without falls although he is clearly not particularly steady in his gait.  The patient has had previous neuropsychological evaluation and patient and wife for planning on bringing in the previous neuropsychological evaluation from their copy and I will also send a request to Dr. Edwena Felty office to see if they have a copy they could send over.  We would like to replicate as much of that previous evaluation is possible to look for progressive changes.  The patient is described as being in bed for roughly 10 hours each night and sleeping much of that time and feels rested in the morning.  He also sleeps 2 hours each day during the day.  Appetite is described as good but continues to have significant weight loss and is gone from 150 pounds to 110 pounds of weight.  Wife is primary caregiver and they are working on home renovations to accommodate changes in motor functioning.  Patient is no longer driving but drove until recently.  Progression in expressive language function deficits and needing help dressing etc. are noted.  Medical History:   Past Medical History:  Diagnosis Date   Adhesive capsulitis of right shoulder 06/19/2019   BCC (basal cell carcinoma)    face   Carpal tunnel syndrome    right wrist   Kidney stones    Osteoarthritis    back, knees, neck   Recurrent upper respiratory infection (URI)    gets colds  infrequently...   Tinea versicolor          Patient Active Problem List   Diagnosis Date Noted   Dystonia of right hand 07/11/2022   Dementia ([facility]) 07/11/2022   Aphasia determined by examination 12/10/2019   Dementia with behavioral disturbance ([facility]) 12/10/2019   MRI of brain abnormal 10/29/2019   Generalized hyperreflexia 10/29/2019   Clumsiness on examination 10/29/2019   Apraxia 10/29/2019   Aphasia 10/29/2019   MCI (mild cognitive impairment) with memory loss 10/29/2019    Onset and Duration of Symptoms: Symptoms clearly developed roughly 4 years  ago but there may have been symptoms even earlier with changes in right arm function but that his changes occurred around the time he had significant mechanical event where he was entangled in a tennis court dividing that and suspended for a period of time.  However, his motor function in his right side of his body has progressed.  Progression of Symptoms: Changes over time are described as rather steady and progressive rather than stepwise.  Triggering Factors: No particular triggering event other than the mechanical injury to his right arm roughly 6 years ago.  Associated Symptoms (e.g., cognitive, emotional, behavioral): Patient's mood and behavior continue to be fairly typical although he is increasingly frail with decreased affect.  Additional Tests and Measures from other records:  Neuroimaging Results:  MRI Brain 07/20/2022:  MRI brain with and without contrast demonstrating: - Moderate perisylvian and mesial temporal atrophy.  Mild ventriculomegaly on ex vacuo basis.  Findings slightly progressed since 2020.  MRI Brain 11/19/2019:  MRI brain (with and without) demonstrating: - Mild atrophy and mild chronic small vessel ischemic disease. Stable from 2017.  - No acute findings.  PET Metabolic Brain 0000000:  IMPRESSION: Biparietal hypometabolism with greater deficit on the LEFT. Additionally there is decreased relative cortical metabolism within the superior LEFT occipital lobe. Typically Alzheimer's disease pathology does not involve the occipital lobes. Correlate clinically with visual defects and consider Lewy body dementia or posterior cortical atrophy syndrome.   Laboratory Tests:  Nerve Conduction Study 08/31/2022:  Conclusion: This is essentially a normal study.  Mild prolonged distal latency on multiple upper extremity sensorimotor studies could be due to cold limb temperature.   Current Typical Mood State: Dysphoric  Sleep: Patient is sleeping more now than he had  previously but reports he feels rested in the morning.  Diet Pattern: Patient has a good diet as far as nutritional status but has progressively lost weight and particularly lost muscle mass going from 150 pounds to 110 pounds of weight.  Behavioral Observation/Mental Status:   ELIEZAR HOCKADAY  presents as a 77 y.o.-year-old Right handed Caucasian Male who appeared his stated age. his dress was Appropriate and he was Well Groomed and his manners were Appropriate to the situation.  his participation was indicative of Appropriate and Inattentive behaviors.  There were physical disabilities noted.  he displayed an appropriate level of cooperation and motivation.    Interactions:    Active Inattentive and Redirectable  Attention:   abnormal and attention span appeared shorter than expected for age  Memory:   abnormal; remote memory intact, recent memory impaired  Visuo-spatial:   abnormal  Speech (Volume):  low  Speech:   non-fluent aphasia; some word finding deficits were noted  Thought Process:  Coherent and Relevant  Concrete and Linear  Though Content:  WNL; not suicidal and not homicidal  Orientation:   person, place, and situation  Judgment:   Fair  Planning:   Poor  Affect:    Blunted and Lethargic  Mood:    Dysphoric  Insight:   Good  Intelligence:   very high  Marital Status/Living:  Patient was born and raised in [place] along with 2 siblings.  Patient currently lives with his wife of 33 years and they have 2 adult children who have advanced educations and are overall healthy.  Wife is primary caregiver.  Educational and Occupational History:     Highest Level of Education:   Patient has completed his masters in business administration always maintaining an excellent GPA.  He graduated from [employer] in [place] and graduated with his masters in business administration in [place] from [employer].  The patient always did well in accounting in math classes.  Patient participated in all types of sports including tennis and golf he continued to play tennis and golf into his 20s.  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  He worked 16 years or more with 3 different companies doing similar job.  Hobbies and Interests: Patient has always been very active in golf, tennis, travel, church activities, family and watching sports on TV.  He has not been able to play golf and tennis for some time.  Impact of Symptoms on Work or School:  Patient has had significant reduction in functioning and physical capacity.  Impact of Symptoms on Social Functioning and Interpersonal Relationships: As a result of changes in language and motor functioning there is been a reduction in social interactions.   Psychiatric History:  No prior psychiatric history noted.  Family Med/Psych History:  Family History  Problem Relation Age of Onset   Pancreatic cancer Brother 70       dx'd 13 months before death   Dementia Mother     Impression/DX:   CARVEL RAYNARD is a 77 year old male referred for neuropsychological evaluation due to ongoing concerns about potential degenerative dementia with considerations including Alzheimer's dementia and Lewy body dementia with previous neuropsychological testing along with other imaging and neurological evaluations.  Patient has continued to take Namenda and Aricept without apparent side effects.  Patient has a past medical history of cervical radiculopathy with pain and weakness in the right hand.  Patient has had extensive PT for his right hand/arm.  Patient uses left hand almost exclusively.  Patient has also had previous carpal tunnel surgery.  Patient has abnormal  electrodiagnostic study of right wrist.  Patient initially presented to neurology after referral from Gavin Pound, MD with symptoms including word finding difficulties, stumbling and stiffness, clumsiness, and cognitive impairments.  Approxihas been noted and description of short-term memory loss.  Why have not been able to see the actual results of the neuropsychological evaluation done in April 2021 there are documentations of this evaluation briefly and Dr. Edwena Felty previous notes.  Dr. Brett Fairy suggest that the testing indicates Alzheimer's type dementia process and correlates with substantial cortical atrophy noted on previous MRIs.  Disposition/Plan:  The patient had previous neuropsychological evaluation in 2021 with Dr. Rica Mote but I have not been able to find those test results in his EMR and have put in request to Dr. Edwena Felty office to see if they may have it and the patient/wife are also instructed to bring a  copy that they have on the day the patient comes in for formal testing procedures.  We will replicate many portions of that previous assessment for direct comparison purposes.  Once the testing is completed we will work on refinement of diagnostic considerations and work on recommendations for patient and wife and coordination of care with his neurology group.  Diagnosis:    No diagnosis found.        Note: This document was prepared using Dragon voice recognition software and may include unintentional dictation errors.   Electronically Signed   _______________________ Ilean Skill, Psy.D. Clinical Neuropsychologist

## 2023-02-21 DIAGNOSIS — M79669 Pain in unspecified lower leg: Secondary | ICD-10-CM | POA: Diagnosis not present

## 2023-02-21 DIAGNOSIS — M542 Cervicalgia: Secondary | ICD-10-CM | POA: Diagnosis not present

## 2023-02-23 DIAGNOSIS — M542 Cervicalgia: Secondary | ICD-10-CM | POA: Diagnosis not present

## 2023-02-23 DIAGNOSIS — M79669 Pain in unspecified lower leg: Secondary | ICD-10-CM | POA: Diagnosis not present

## 2023-02-27 ENCOUNTER — Ambulatory Visit: Payer: PPO | Admitting: Neurology

## 2023-02-28 DIAGNOSIS — M542 Cervicalgia: Secondary | ICD-10-CM | POA: Diagnosis not present

## 2023-02-28 DIAGNOSIS — M79669 Pain in unspecified lower leg: Secondary | ICD-10-CM | POA: Diagnosis not present

## 2023-03-02 ENCOUNTER — Encounter: Payer: PPO | Attending: Psychology

## 2023-03-02 DIAGNOSIS — R4189 Other symptoms and signs involving cognitive functions and awareness: Secondary | ICD-10-CM | POA: Diagnosis not present

## 2023-03-02 NOTE — Progress Notes (Signed)
   Behavioral Observations:  The patient appeared well-groomed and appropriately dressed. His manners were polite and appropriate to the situation. The patient struggled greatly with word-finding at times and his essential tremor was evident on tasks involving motor skills (used non-dominant hand for drawing). The patient became notably frustrated beginning with the paired associates subtest of the WMS. His overall attitude towards testing was positive and his effort was good.   Neuropsychology Note  Alexander Cummings completed 170 minutes of neuropsychological testing with technician, Wandra Scot, BA, under the supervision of Ilean Skill, PsyD., Clinical Neuropsychologist. The patient did not appear overtly distressed by the testing session, per behavioral observation or via self-report to the technician. Rest breaks were offered.   Clinical Decision Making: In considering the patient's current level of functioning, level of presumed impairment, nature of symptoms, emotional and behavioral responses during clinical interview, level of literacy, and observed level of motivation/effort, a battery of tests was selected by Dr. Sima Matas during initial consultation on 02/16/2023. This was communicated to the technician. Communication between the neuropsychologist and technician was ongoing throughout the testing session and changes were made as deemed necessary based on patient performance on testing, technician observations and additional pertinent factors such as those listed above.  Tests Administered: Finger Tapping Test (FTT) Grooved Pegboard Wechsler Adult Intelligence Scale, 4th Edition (WAIS-IV) Wechsler Memory Scale, 4th Edition (WMS-IV); Older Adult Battery   Results: Will be included in final report   Feedback to Patient: Alexander Cummings will return on 04/20/2023 for an interactive feedback session with Dr. Sima Matas at which time his test performances, clinical impressions and  treatment recommendations will be reviewed in detail. The patient understands he can contact our office should he require our assistance before this time.  170 minutes spent face-to-face with patient administering standardized tests, 30 minutes spent scoring Environmental education officer). [CPT [id number], [id number]  Full report to follow.

## 2023-03-07 DIAGNOSIS — M79669 Pain in unspecified lower leg: Secondary | ICD-10-CM | POA: Diagnosis not present

## 2023-03-07 DIAGNOSIS — M542 Cervicalgia: Secondary | ICD-10-CM | POA: Diagnosis not present

## 2023-03-09 DIAGNOSIS — M542 Cervicalgia: Secondary | ICD-10-CM | POA: Diagnosis not present

## 2023-03-09 DIAGNOSIS — M79669 Pain in unspecified lower leg: Secondary | ICD-10-CM | POA: Diagnosis not present

## 2023-03-14 DIAGNOSIS — M542 Cervicalgia: Secondary | ICD-10-CM | POA: Diagnosis not present

## 2023-03-14 DIAGNOSIS — M79669 Pain in unspecified lower leg: Secondary | ICD-10-CM | POA: Diagnosis not present

## 2023-03-16 DIAGNOSIS — M79669 Pain in unspecified lower leg: Secondary | ICD-10-CM | POA: Diagnosis not present

## 2023-03-16 DIAGNOSIS — M542 Cervicalgia: Secondary | ICD-10-CM | POA: Diagnosis not present

## 2023-03-21 DIAGNOSIS — M79669 Pain in unspecified lower leg: Secondary | ICD-10-CM | POA: Diagnosis not present

## 2023-03-21 DIAGNOSIS — M542 Cervicalgia: Secondary | ICD-10-CM | POA: Diagnosis not present

## 2023-03-23 DIAGNOSIS — M79669 Pain in unspecified lower leg: Secondary | ICD-10-CM | POA: Diagnosis not present

## 2023-03-23 DIAGNOSIS — M542 Cervicalgia: Secondary | ICD-10-CM | POA: Diagnosis not present

## 2023-03-28 DIAGNOSIS — L219 Seborrheic dermatitis, unspecified: Secondary | ICD-10-CM | POA: Diagnosis not present

## 2023-03-28 DIAGNOSIS — Z85820 Personal history of malignant melanoma of skin: Secondary | ICD-10-CM | POA: Diagnosis not present

## 2023-03-28 DIAGNOSIS — Z85828 Personal history of other malignant neoplasm of skin: Secondary | ICD-10-CM | POA: Diagnosis not present

## 2023-03-28 DIAGNOSIS — B360 Pityriasis versicolor: Secondary | ICD-10-CM | POA: Diagnosis not present

## 2023-03-28 DIAGNOSIS — D225 Melanocytic nevi of trunk: Secondary | ICD-10-CM | POA: Diagnosis not present

## 2023-03-28 DIAGNOSIS — D2271 Melanocytic nevi of right lower limb, including hip: Secondary | ICD-10-CM | POA: Diagnosis not present

## 2023-03-28 DIAGNOSIS — M542 Cervicalgia: Secondary | ICD-10-CM | POA: Diagnosis not present

## 2023-03-28 DIAGNOSIS — M79669 Pain in unspecified lower leg: Secondary | ICD-10-CM | POA: Diagnosis not present

## 2023-03-28 DIAGNOSIS — L578 Other skin changes due to chronic exposure to nonionizing radiation: Secondary | ICD-10-CM | POA: Diagnosis not present

## 2023-03-28 DIAGNOSIS — L570 Actinic keratosis: Secondary | ICD-10-CM | POA: Diagnosis not present

## 2023-03-28 DIAGNOSIS — L821 Other seborrheic keratosis: Secondary | ICD-10-CM | POA: Diagnosis not present

## 2023-03-30 DIAGNOSIS — M542 Cervicalgia: Secondary | ICD-10-CM | POA: Diagnosis not present

## 2023-03-30 DIAGNOSIS — M79669 Pain in unspecified lower leg: Secondary | ICD-10-CM | POA: Diagnosis not present

## 2023-03-31 ENCOUNTER — Other Ambulatory Visit: Payer: Self-pay | Admitting: Neurology

## 2023-04-04 DIAGNOSIS — M542 Cervicalgia: Secondary | ICD-10-CM | POA: Diagnosis not present

## 2023-04-04 DIAGNOSIS — M79669 Pain in unspecified lower leg: Secondary | ICD-10-CM | POA: Diagnosis not present

## 2023-04-06 DIAGNOSIS — M79669 Pain in unspecified lower leg: Secondary | ICD-10-CM | POA: Diagnosis not present

## 2023-04-06 DIAGNOSIS — M542 Cervicalgia: Secondary | ICD-10-CM | POA: Diagnosis not present

## 2023-04-11 DIAGNOSIS — M542 Cervicalgia: Secondary | ICD-10-CM | POA: Diagnosis not present

## 2023-04-11 DIAGNOSIS — M79669 Pain in unspecified lower leg: Secondary | ICD-10-CM | POA: Diagnosis not present

## 2023-04-12 ENCOUNTER — Encounter: Payer: Self-pay | Admitting: Psychology

## 2023-04-12 ENCOUNTER — Encounter: Payer: PPO | Attending: Psychology | Admitting: Psychology

## 2023-04-12 DIAGNOSIS — F0280 Dementia in other diseases classified elsewhere without behavioral disturbance: Secondary | ICD-10-CM | POA: Diagnosis not present

## 2023-04-12 DIAGNOSIS — R482 Apraxia: Secondary | ICD-10-CM | POA: Insufficient documentation

## 2023-04-12 DIAGNOSIS — R4701 Aphasia: Secondary | ICD-10-CM | POA: Insufficient documentation

## 2023-04-12 DIAGNOSIS — G3183 Dementia with Lewy bodies: Secondary | ICD-10-CM | POA: Diagnosis not present

## 2023-04-12 DIAGNOSIS — F02B0 Dementia in other diseases classified elsewhere, moderate, without behavioral disturbance, psychotic disturbance, mood disturbance, and anxiety: Secondary | ICD-10-CM | POA: Diagnosis not present

## 2023-04-12 NOTE — Progress Notes (Signed)
Neuropsychological Evaluation   Patient:  Alexander Cummings   DOB: [birth year]  MR Number: [id number]  Location: [facility] [place] [facility] [address] [place], [address] [id number] [facility] [place] [address] Dept: [phone]  Start: 9 AM End: 10 AM  Provider/Observer:     Hershal Coria PsyD  Chief Complaint:      Chief Complaint  Patient presents with   Memory Loss   Gait Problem    Reason For Service:      Alexander Cummings is a 77 year old male referred for neuropsychological evaluation by his treating neurologist  Melvyn Novas, MD due to ongoing concerns about potential degenerative dementia with considerations including Alzheimer's dementia and Lewy body dementia with previous neuropsychological testing along with other imaging and neurological evaluations.  Patient has continued to take Namenda and Aricept without apparent side effects.  Patient has a past medical history of cervical radiculopathy with pain and weakness in the right hand.  Patient has had extensive PT for his right hand/arm.  Patient uses left hand almost exclusively.  Patient has also had previous carpal tunnel surgery.  Patient has abnormal electrodiagnostic study of right wrist.  Patient initially presented to neurology after referral from Gilman Schmidt, MD with symptoms including word finding difficulties, stumbling and stiffness, clumsiness, and cognitive impairments have been noted and description of short-term memory loss.  I have not been able to see the actual results of the neuropsychological evaluation done in April 2021.  There are documentations of this evaluation briefly in Dr. Oliva Bustard previous notes.  Dr. Vickey Huger suggest that the testing indicates Alzheimer's type dementia process and correlates with substantial cortical atrophy noted on previous MRIs.   The patient has continued to lose weight and losing overall muscle mass.   He has progressively began to look frail when he had been quite physically active including an active tennis player.  Patient has identified and displayed tremor and decreasing performance on memory test screening.  There have been more concerns about frontal lobe involvement in his progressive dementia with the presentation of increasing worsening of symptoms and addition of symptoms.   During today's face-to-face clinical interview, the patient and his wife were both present.  When reviewing timeline and history of presentation of symptoms, the patient first began having significant issues with his right arm approximately 6 years ago.  However, at that time he had had a physical injury while playing tennis after his right arm had gotten entangled in a court dividing that that and he was essentially suspended off the ground for period of time.  He suffered an arm injury but continued to have trouble with that arm including having carpal tunnel symptoms and was seen by neurosurgery.  The patient, however, has progressively worsening symptoms with his right arm and now has lost most control over his right arm.  Previous EMG studies have not been able to identify deficits to the point for peripheral nerve function to explain the degree of motor control loss in his right arm.  Patient and wife report that 4 years ago they began noting more changes in his overall gait and the fact that the patient is now using his left hand almost exclusively.  Tremors were noted to have begun roughly 4 to 5 years ago even when at the time he was rather healthy overall.  The patient denies any significant pain.  The patient has lost roughly 40 pounds of weight over the  past several years without purposeful attempts at weight loss.  He is now quite thin and frail.  The patient is having increasing difficulties with comprehension and understanding, executive functioning, visual-spatial awareness, word finding and word recall,  expressive language changes, short-term memory difficulties and periods of confusion.  He is having increased difficulties managing ADLs particularly due to motor functioning and is overall weak and tends to be repetitive in thoughts and statements.  Essentially symptoms began to present roughly 4 years ago with the patient having MRI, PET scan and previous neuropsychological testing consistent with left brain atrophy, which is also consistent with right arm motor functioning and expressive language changes.  These changes are described as a gradual decline in capabilities that has been rather steady in change.  One of the biggest changes has been changes in his expressive language with significant difficulties with word finding.  Consistencies of difficulties and day-to-day functioning are typical although when he has a change of scenery and is away from his home he may have significant worsening of symptoms.  The patient also may have times where dehydration is playing a role in worsening of symptoms.  Primary medications include Namenda and Aricept.   Patient and wife both deny any visual hallucinations or visual disturbance.  There are significant gait changes but no particular changes in geographic orientation.  The patient is still walking without falls although he is clearly not particularly steady in his gait.   The patient has had previous neuropsychological evaluation with the previous provider is not part of the epic environment and these results are not available.  The patient and his wife were going to attempt to bring in these results but I have not been able to review them as of yet.   The patient is described as being in bed for roughly 10 hours each night and sleeping much of that time and feels rested in the morning.  He also sleeps 2 hours each day during the day.  Appetite is described as good but continues to have significant weight loss and has gone from 150 pounds to 110 pounds.  Wife is  primary caregiver and they are working on home renovations to accommodate changes in motor functioning.  Patient is no longer driving but drove until recently.  Progression in expressive language function deficits and needing help dressing etc. are noted.   Medical History:                         Past Medical History:  Diagnosis Date   Adhesive capsulitis of right shoulder 06/19/2019   BCC (basal cell carcinoma)      face   Carpal tunnel syndrome      right wrist   Kidney stones     Osteoarthritis      back, knees, neck   Recurrent upper respiratory infection (URI)      gets colds infrequently...   Tinea versicolor                                                           Patient Active Problem List    Diagnosis Date Noted   Dystonia of right hand 07/11/2022   Dementia (HCC) 07/11/2022   Aphasia determined by examination 12/10/2019   Dementia with behavioral disturbance (  HCC) 12/10/2019   MRI of brain abnormal 10/29/2019   Generalized hyperreflexia 10/29/2019   Clumsiness on examination 10/29/2019   Apraxia 10/29/2019   Aphasia 10/29/2019   MCI (mild cognitive impairment) with memory loss 10/29/2019      Onset and Duration of Symptoms: Symptoms clearly developed roughly 4 years ago but there may have been symptoms even earlier with changes in right arm function but that his changes occurred around the time he had significant mechanical event where he was entangled in a tennis court dividing that and patient was suspended by his right hand and arm for a period of time.  However, his motor function in his right side of his body beyond simply right hand and arm has progressed.   Progression of Symptoms: Changes over time are described as rather steady and progressive rather than stepwise.   Triggering Factors: No particular triggering event other than the mechanical injury to his right arm roughly 6 years ago.   Associated Symptoms (e.g., cognitive, emotional, behavioral):  Patient's mood and behavior continue to be fairly typical, although he is increasingly frail with decreased affect.   Additional Tests and Measures from other records:   Neuroimaging Results:   MRI Brain 07/20/2022:  MRI brain with and without contrast demonstrating: - Moderate perisylvian and mesial temporal atrophy.  Mild ventriculomegaly on ex vacuo basis.  Findings slightly progressed since 2020.   MRI Brain 11/19/2019:  MRI brain (with and without) demonstrating: - Mild atrophy and mild chronic small vessel ischemic disease. Stable from 2017.  - No acute findings.   PET Metabolic Brain 07/22/2022:  IMPRESSION: Biparietal hypometabolism with greater deficit on the LEFT. Additionally there is decreased relative cortical metabolism within the superior LEFT occipital lobe. Typically Alzheimer's disease pathology does not involve the occipital lobes. Correlate clinically with visual defects and consider Lewy body dementia or posterior cortical atrophy syndrome.  Tests Administered: Finger Tapping Test (FTT) Grooved Pegboard Controlled Oral Word Association Test (COWAT; FAS & Animals)  Wechsler Adult Intelligence Scale, 4th Edition (WAIS-IV) Wechsler Memory Scale, 4th Edition (WMS-IV); Older Adult Battery   Participation Level:   Active  Participation Quality:  Appropriate      Behavioral Observation:  The patient appeared well-groomed and appropriately dressed. His manners were polite and appropriate to the situation. The patient struggled greatly with word-finding at times and his cervical radiculopathy with pain and weakness in the right hand was evident on tasks involving motor skills (used non-dominant hand for drawing). The patient became notably frustrated beginning with the paired associates subtest of the WMS. His overall attitude towards testing was positive and his effort was good.   Well Groomed, Alert, and Appropriate.   Test Results:   Initially, an estimation was made  as to the patient's premorbid intellectual and cognitive functioning to provide a comparison point for current testing results.  The patient had completed his masters in business administration always doing very well in school.  Patient graduated with his MBA from the [employer] [address] doing well in accounting in math classes.  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  I would suspect that the patient has performed premorbidly in the high average to superior range relative to normative population intellectually and cognitively throughout his life.  We will utilize comparison points of roughly 130 composite scores and greater than 80th percentile for comparison purposes.  Review of available test performances and behavioral observations suggest that the patient tried his hardest throughout  this assessment procedure it was notable frustration beginning on some aspects of his memory testing patient struggled with word finding issues and difficulties with his right dominant hand.  However, he appeared to try his hardest throughout and even with his frustration continued to persevere.  This does appear to be a fair and valid assessment of his current cognitive functioning levels.  Finger Tapping Test (FTT): R (DH) Average=6.4  Percentile Rank=12th L (NDH) Average=12.8 Percentile Rank=11th  The patient was administered the finger tapping test to assess gross motor functioning in right dominant versus left nondominant hand functions to assess potential lateralization of findings.  The patient has had previous right hand injury with peripheral nerve involvement and uses his left hand almost exclusively.  The patient did perform better on finger tapping test with his left nondominant hand but both of these performances were impaired.  Given the fact that his left nondominant hand is used almost exclusively but continues to show impairment  as far as gross motor speed this would indicate that motor deficits are beyond those solely explained by previous right hand injury.   Grooved Pegboard:  Pt was unable to complete grooved pegboard with either hand after 3 mins. Tremor observed on right side (dominant hand).    An attempt was made to administer the grooved pegboard test to assess for fine motor control.  Expectations were there for great difficulty with his right hand but the patient was unable to complete this measure for either hand and was discontinued at the 3-minute mark.  Tremors were noted for his right hand on this measure but he also had significant fine motor control deficits for his left nondominant hand as well.  COWAT:  FAS total= 8 Z= -2.09 Animals total=5 Z= -2.78 The patient was administered the control oral Word assessment test to get an objective assessment of both lexical fluency and semantic fluency.  Subjective reports by the patient and his wife as well as direct behavioral observations have been consistent with changes in expressive language functioning.  On the FAS test which assesses lexical fluency the patient was only able to produce a total of 8 words during this challenge.  His performance is 2 standard deviations below performance levels expected for his age, gender and educational performance levels indicating significant deterioration in lexical fluency.  The patient's performance on the animal naming portion of this measure also showed significant and even greater deficits for semantic fluency as he produced a total of 5 words under this challenge.  This is 2.78 standard deviations below normative expectations.  This pattern is consistent with subjective reports and behavioral observations and given the patient's educational and occupational history this likely represents a significant change from premorbid functioning levels.   WAIS-IV:           Composite Score Summary          Scale Sum of Scaled  Scores Composite Score Percentile Rank 95% Conf. Interval Qualitative Description  Verbal Comprehension 13 VCI 68 2 64-75 Extremely Low  Perceptual Reasoning 13 PRI 67 1 62-75 Extremely Low  Working Memory 7 WMI 63 1 58-72 Extremely Low  Processing Speed 2 PSI 50 <0.1 47-63 Extremely Low  Full Scale 35 FSIQ 57 0.2 54-62 Extremely Low  General Ability 26 GAI 64 1 60-70 Extremely Low    In order to obtain an assessment of a wide range of cognitive and intellectual domains the patient was administered the Wechsler Adult Intelligence Scale's-IV.  While this measure is  typically used to assess intellectual capacity the patient and his wife as well as medical records indicate significant changes in cognitive functioning over the past several years.  The current measures should not be used as indicative of his lifelong premorbid functioning but rather a current snapshot in global as well as specific cognitive domains.  There were 2 composite scores calculated as part of this assessment.  The patient produced a full-scale IQ score of 57 which is at the point 2 percentile relative to a normative population and in the extremely low range relative to a normative population.  This is significantly below premorbid estimates representing a nearly 70 point difference between premorbid predictions and current functioning levels.  This would suggest multiple domains of cognitive functioning are being impacted.  We also calculated the patient's general abilities index score which places less emphasis on elements most sensitive to acute change including auditory encoding and information processing speed.  The patient produced a general abilities index score of 64 which falls at the 1st percentile and also in the extremely low range relative to a normative population.  This slight improvement is due to his greatest area of deficit having to do with information processing speed changes.           Verbal Comprehension  Subtests Summary        Subtest Raw Score Scaled Score Percentile Rank Reference Group Scaled Score SEM  Similarities 12 5 5 4  0.95  Vocabulary 12 4 2 4  0.67  Information 4 4 2 5  0.73    The patient produced a verbal comprehension index score of 68 which falls at the 2nd percentile and is in a similar range relative to a normative population.  This is significantly below premorbid estimates and indicate significant change in verbal based cognitive skills.  There was consistency between subtest performance on this composite score with the patient performing well below predicted levels of premorbid functioning with moderate impairments with regard to verbal reasoning and problem-solving abilities, capacity to retrieve well learned vocabulary type knowledge and capacity to retrieve well learned factual based type information as well.  This pattern would suggest significant weaknesses for both frontal and temporal lobe brain regions.          Perceptual Reasoning Subtests Summary        Subtest Raw Score Scaled Score Percentile Rank Reference Group Scaled Score SEM  Block Design 11 4 2 3  0.99  Matrix Reasoning 7 7 16 3  0.90  Visual Puzzles 2 2 0.4 1 0.99    The patient produced a perceptual reasoning index score of 67 which falls at the 1st percentile and an extremely range relative to a normative population.  There was some degree of scatter noted but all of his performances were well below predicted levels of premorbid functioning.  The patient did best on measures assessing broad visual intelligence and nonverbal reasoning skills but these performances were still in the low end of average range to mildly impaired and significantly below premorbid estimates of functioning in this area.  The patient had even greater deficits and weaknesses with regard to his visual analysis and organizational skills and his visual processing skills and attention to detail.  His greatest deficits were on measures  requiring fluid reasoning skills rather than broad visual intelligence.          Working Comptroller Raw Score Scaled Score Percentile Rank Reference Group Scaled Score SEM  Digit Span 10 2 0.4 1 0.73  Arithmetic 7 5 5 5  1.20    The patient produced a working memory index score of 63 which falls at the 1st percentile and is in the similar range relative to a normative population.  Again, this is significantly below premorbid estimates of functioning with greatest weakness of due to his primary capacity to encode auditory information but the information that he is able to encode he is able to process.  Significant deficits for auditory encoding are noted and this level of performance would be extremely inconsistent with estimates of premorbid functioning.  This level of auditory encoding deficits would have a profound impact on his capacity to store and organize new information as he would have difficulty maintaining information in his active Register long enough for adequate engagement and incorporation into short-term memory stores.          Processing Speed Subtests Summary        Subtest Raw Score Scaled Score Percentile Rank Reference Group Scaled Score SEM  Symbol Search 2 1 0.1 1 1.12  Coding 0 1 0.1 1 1.12    The patient produced a processing speed index score 50 which falls below the 0.1 percentile relative to normative population and well below predictions of premorbid functioning.  However, the degree of impairments on these particular test needs to take into account the fact that he utilized his left nondominant hand for this procedure which likely had some impact.  The patient is showing significant changes in both gross motor speed and fine motor coordination which likely pushed these performance levels below.  However, even with these primary deficits noted the level of impairment noted on both of the subtest and this composite score would suggest that he has  having significant reduction in information processing speed and issues with working memory/visual encoding as well as the already identified auditory encoding deficits.  The patient is having difficulty absorbing new visual information.   WMS-IV:          Index Score Summary        Index Sum of Scaled Scores Index Score Percentile Rank 95% Confidence Interval Qualitative Descriptor  Auditory Memory (AMI) 4 40 <0.1 37-49 Extremely Low  Visual Memory (VMI) 2 40 <0.1 37-47 Extremely Low  Immediate Memory (IMI) 3 40 <0.1 37-49 Extremely Low  Delayed Memory (DMI) 3 40 <0.1 37-52 Extremely Low    The patient was then administered the Wechsler Memory Scale-IV to objectively assess a wide range of memory and learning components in a systematic and structured way.  As can be seen in the chart above depicting his performance on these measures the patient showed severe and profound deficits for memory and learning.  This was identified for both auditory and visual memory and learning.  Given the degree of deficits identified for primary auditory encoding and results of visual encoding measures on the [place] memory scales a great deal of his impaired memory could be attributed to fundamental and primary deficits in his capacity to adequately encode auditory and visual information.  However, even with those encoding deficits the level of memory deficits for both visual and auditory memory functions would also suggest great difficulty storing and organizing new information.  The patient's performance does not improve under cued/recognition formats suggesting that his memory deficits are not simply related to retrieval deficits but impaired encoding, storage and organization of new experiential information.   Subtest Domain Raw Score Scaled Score Percentile Rank  Logical  Memory I AM 5 1 0.1  Logical Memory II AM 0 1 0.1  Verbal Paired Associates I AM 1 1 0.1  Verbal Paired Associates II AM 0 1 0.1  Visual  Reproduction I VM 4 1 0.1  Visual Reproduction II VM 0 1 0.1  Symbol Span VWM 1 2 0.4          Auditory Memory Process Score Summary      Process Score Raw Score Scaled Score Percentile Rank Cumulative Percentage (Base Rate)  LM II Recognition 12 - - <=2%  VPA II Recognition 12 - - <=2%           Visual Memory Process Score Summary      Process Score Raw Score Scaled Score Percentile Rank Cumulative Percentage (Base Rate)  VR II Recognition 2 - - 10-16%     Summary of Results:   Overall, the results of the current neuropsychological evaluation do show consistencies with subjective reports and behavioral observations with the patient.  While the patient has had right hand and arm injury with peripheral nerve involvement he is showing significant deficits for both his right dominant hand and left nondominant hand for gross motor speed as well as fine motor control.  These deficits could not be explained by his right hand injury and likely represents significant frontal lobe and potentially subcortical involvement in his deficits.  The patient is showing significant decrease in global cognitive functioning with global cognitive functioning being roughly 2-3 standard deviations below estimates of premorbid functioning.  These deficits were identified for his capacity to retrieve longstanding information, deficits for executive functioning for both verbal and visual reasoning and problem-solving capacity, significant slowed information processing speed both visually and auditorily, significant changes in expressive language for both lexical and semantic fluency, significant changes in deficits for visual spatial and visual processing capacity, significant deficits with regard to verbal and auditory encoding and attentional capacities and significant deficits for memory and learning both auditorily and visually.  No significant improvement under recognition/cued recall formats were  noted.  Impression/Diagnosis:   Overall, the results of the current neuropsychological evaluation do show consistencies between available medical information and medical history, subjective reports by the patient and his wife and obtained neuropsychological test performance.  While I was not able to get previous neuropsychological test data to review the descriptions of his performance on previous neuropsychological testing would suggest that there has been progression over the past year which is confirmed by the patient and his wife.  MRI of brain previously has identified moderate temporal lobe atrophy with progression over the past 4 years and mild overall atrophy with mild chronic small vessel ischemic disease noted going back to 2017 but not significantly progressed over repeated MRIs.  The patient did have a PET metabolic brain scan conducted in 2023 with bilateral hypometabolism noted greater left than right.  There was also relative decreased metabolism within the superior left occipital lobe noted as well.  Radiological interpretation of the PET scan were not viewed as typically consistent with patterns seen with Alzheimer's type dementia and concern for significant Lewy body dementia or postural cortical atrophy syndrome noted with this study.  The obtain neuropsychological test performance is also not what is typically associated with progressive Alzheimer's type dementia process.  Significant motor involvement including motor involvement presenting as a very early symptom and now significant visual processing and changes in fluid reasoning skills are noted and significant profound deficits for both auditory and visual encoding capacity  are noted.  The patient does not report any visual hallucinations or delusions but does note significant tremor and for symptoms noted were generally motor in nature.  Patient is now displaying significant deficits for lexical and semantic verbal fluency, significant  changes in executive functioning, significant decrease in information processing speed and significant and profound deficits with regard to his capacity to learn new visual and auditory information.  The pattern of cognitive strict and weaknesses would go beyond those typically seen with postural cortical atrophy syndrome.  Diagnostically relevant information would also go beyond those typically seen with frontotemporal dementia as well.  The pattern of cognitive strengths and weaknesses, obtained subjective reports of symptoms and presentation timelines and MRI/PET scans would be most consistent with Lewy body dementia.  The patient does clearly have a progressive dementia process going on with steady decline over the past 4 years or more.  As far as treatment recommendations, the patient and his wife are already working on home renovations to adapt to his progressive changes which are clearly needed.  Given the fact that this is likely a progressive process and he will continue to decline these adaptations will likely prove inadequate with time as well.  Life planning for how the patient and his family will manage the progressive nature of his condition will be important and I will spend time during the feedback session going over specific questions they may have and recommendations for how to proceed.  The patient has an excellent neurology team led by Dr. Vickey Huger with specialist within the neurology department consulting on specific aspects of his condition.  I will defer to her for how she wants to proceed from a medical standpoint regarding the patient's progressive neurological condition.  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 Clinical  Neuropsychologist

## 2023-04-13 DIAGNOSIS — M542 Cervicalgia: Secondary | ICD-10-CM | POA: Diagnosis not present

## 2023-04-13 DIAGNOSIS — M79669 Pain in unspecified lower leg: Secondary | ICD-10-CM | POA: Diagnosis not present

## 2023-04-18 DIAGNOSIS — M79669 Pain in unspecified lower leg: Secondary | ICD-10-CM | POA: Diagnosis not present

## 2023-04-18 DIAGNOSIS — M542 Cervicalgia: Secondary | ICD-10-CM | POA: Diagnosis not present

## 2023-04-20 ENCOUNTER — Encounter: Payer: PPO | Admitting: Psychology

## 2023-04-20 DIAGNOSIS — M79669 Pain in unspecified lower leg: Secondary | ICD-10-CM | POA: Diagnosis not present

## 2023-04-20 DIAGNOSIS — F02B0 Dementia in other diseases classified elsewhere, moderate, without behavioral disturbance, psychotic disturbance, mood disturbance, and anxiety: Secondary | ICD-10-CM | POA: Diagnosis not present

## 2023-04-20 DIAGNOSIS — M542 Cervicalgia: Secondary | ICD-10-CM | POA: Diagnosis not present

## 2023-04-20 DIAGNOSIS — R4701 Aphasia: Secondary | ICD-10-CM | POA: Diagnosis not present

## 2023-04-20 DIAGNOSIS — G3183 Dementia with Lewy bodies: Secondary | ICD-10-CM | POA: Diagnosis not present

## 2023-04-20 DIAGNOSIS — R482 Apraxia: Secondary | ICD-10-CM | POA: Diagnosis not present

## 2023-04-20 DIAGNOSIS — F0280 Dementia in other diseases classified elsewhere without behavioral disturbance: Secondary | ICD-10-CM

## 2023-04-26 ENCOUNTER — Encounter: Payer: Self-pay | Admitting: Psychology

## 2023-04-26 NOTE — Progress Notes (Signed)
Neuropsychological Evaluation   Patient:  Alexander Cummings   DOB: 07-25-1946  MR Number: [id number]  Location: [facility] FOR PAIN AND [facility] MEDICINE [facility]ICINE & REHABILIT[facility] [address] [place], [address] [id number] Alexander Cummings: [phone]  Start: 3 PM End: 4 PM  Provider/Observer:     Alexander Coria PsyD  Chief Complaint:      Chief Complaint  Patient presents with   Memory Loss   Gait Problem    Today I provided feedback regarding the results of the recent neuropsychological evaluation to the patient and family.  We reviewed the diagnostic considerations with strong clinical evidence suggesting moderate Lewy body dementia as the primary diagnostic consideration.  We reviewed both the diagnostic considerations as well as treatment recommendations going forward.  You can find the patient's full neuropsych evaluations in the patient's EMR dated 04/12/2023 and the full background/clinical visit and information dated 02/16/2023.  Reason For Service:      Alexander Cummings is a 77 year old male referred for neuropsychological evaluation by his treating neurologist  Alexander Novas, Cummings due to ongoing concerns about potential degenerative dementia with considerations including Alzheimer's dementia and Lewy body dementia with previous neuropsychological testing along with other imaging and neurological evaluations.  Patient has continued to take Namenda and Aricept without apparent side effects.  Patient has a past medical history of cervical radiculopathy with pain and weakness in the right hand.  Patient has had extensive PT for his right hand/arm.  Patient uses left hand almost exclusively.  Patient has also had previous carpal tunnel surgery.  Patient has abnormal electrodiagnostic study of right wrist.  Patient initially presented to neurology after referral from Alexander Cummings with symptoms including word finding difficulties,  stumbling and stiffness, clumsiness, and cognitive impairments have been noted and description of short-term memory loss.  I have not been able to see the actual results of the neuropsychological evaluation done in April 2021.  There are documentations of this evaluation briefly in Alexander Cummings previous notes.  Alexander Cummings suggest that the testing indicates Alzheimer's type dementia process and correlates with substantial cortical atrophy noted on previous MRIs.   The patient has continued to lose weight and losing overall muscle mass.  He has progressively began to look frail when he had been quite physically active including an active tennis player.  Patient has identified and displayed tremor and decreasing performance on memory test screening.  There have been more concerns about frontal lobe involvement in his progressive dementia with the presentation of increasing worsening of symptoms and addition of symptoms.   During today's face-to-face clinical interview, the patient and his wife were both present.  When reviewing timeline and history of presentation of symptoms, the patient first began having significant issues with his right arm approximately 6 years ago.  However, at that time he had had a physical injury while playing tennis after his right arm had gotten entangled in a court dividing that that and he was essentially suspended off the ground for period of time.  He suffered an arm injury but continued to have trouble with that arm including having carpal tunnel symptoms and was seen by neurosurgery.  The patient, however, has progressively worsening symptoms with his right arm and now has lost most control over his right arm.  Previous EMG studies have not been able to identify deficits to the point for peripheral nerve function to explain the degree of motor control loss in his  right arm.  Patient and wife report that 4 years ago they began noting more changes in his overall gait and the fact  that the patient is now using his left hand almost exclusively.  Tremors were noted to have begun roughly 4 to 5 years ago even when at the time he was rather healthy overall.  The patient denies any significant pain.  The patient has lost roughly 40 pounds of weight over the past several years without purposeful attempts at weight loss.  He is now quite thin and frail.  The patient is having increasing difficulties with comprehension and understanding, executive functioning, visual-spatial awareness, word finding and word recall, expressive language changes, short-term memory difficulties and periods of confusion.  He is having increased difficulties managing ADLs particularly due to motor functioning and is overall weak and tends to be repetitive in thoughts and statements.  Essentially symptoms began to present roughly 4 years ago with the patient having MRI, PET scan and previous neuropsychological testing consistent with left brain atrophy, which is also consistent with right arm motor functioning and expressive language changes.  These changes are described as a gradual decline in capabilities that has been rather steady in change.  One of the biggest changes has been changes in his expressive language with significant difficulties with word finding.  Consistencies of difficulties and day-to-day functioning are typical although when he has a change of scenery and is away from his home he may have significant worsening of symptoms.  The patient also may have times where dehydration is playing a role in worsening of symptoms.  Primary medications include Namenda and Aricept.   Patient and wife both deny any visual hallucinations or visual disturbance.  There are significant gait changes but no particular changes in geographic orientation.  The patient is still walking without falls although he is clearly not particularly steady in his gait.   The patient has had previous neuropsychological evaluation with  the previous provider is not part of the epic environment and these results are not available.  The patient and his wife were going to attempt to bring in these results but I have not been able to review them as of yet.   The patient is described as being in bed for roughly 10 hours each night and sleeping much of that time and feels rested in the morning.  He also sleeps 2 hours each day during the day.  Appetite is described as good but continues to have significant weight loss and has gone from 150 pounds to 110 pounds.  Wife is primary caregiver and they are working on home renovations to accommodate changes in motor functioning.  Patient is no longer driving but drove until recently.  Progression in expressive language function deficits and needing help dressing etc. are noted.   Medical History:                         Past Medical History:  Diagnosis Date   Adhesive capsulitis of right shoulder 06/19/2019   BCC (basal cell carcinoma)      face   Carpal tunnel syndrome      right wrist   Kidney stones     Osteoarthritis      back, knees, neck   Recurrent upper respiratory infection (URI)      gets colds infrequently...   Tinea versicolor  Patient Active Problem List    Diagnosis Date Noted   Dystonia of right hand 07/11/2022   Dementia (HCC) 07/11/2022   Aphasia determined by examination 12/10/2019   Dementia with behavioral disturbance (HCC) 12/10/2019   MRI of brain abnormal 10/29/2019   Generalized hyperreflexia 10/29/2019   Clumsiness on examination 10/29/2019   Apraxia 10/29/2019   Aphasia 10/29/2019   MCI (mild cognitive impairment) with memory loss 10/29/2019      Onset and Duration of Symptoms: Symptoms clearly developed roughly 4 years ago but there may have been symptoms even earlier with changes in right arm function but that his changes occurred around the time he had significant mechanical event where he  was entangled in a tennis court dividing that and patient was suspended by his right hand and arm for a period of time.  However, his motor function in his right side of his body beyond simply right hand and arm has progressed.   Progression of Symptoms: Changes over time are described as rather steady and progressive rather than stepwise.   Triggering Factors: No particular triggering event other than the mechanical injury to his right arm roughly 6 years ago.   Associated Symptoms (e.g., cognitive, emotional, behavioral): Patient's mood and behavior continue to be fairly typical, although he is increasingly frail with decreased affect.   Additional Tests and Measures from other records:   Neuroimaging Results:   MRI Brain 07/20/2022:  MRI brain with and without contrast demonstrating: - Moderate perisylvian and mesial temporal atrophy.  Mild ventriculomegaly on ex vacuo basis.  Findings slightly progressed since 2020.   MRI Brain 11/19/2019:  MRI brain (with and without) demonstrating: - Mild atrophy and mild chronic small vessel ischemic disease. Stable from 2017.  - No acute findings.   PET Metabolic Brain 07/22/2022:  IMPRESSION: Biparietal hypometabolism with greater deficit on the LEFT. Additionally there is decreased relative cortical metabolism within the superior LEFT occipital lobe. Typically Alzheimer's disease pathology does not involve the occipital lobes. Correlate clinically with visual defects and consider Lewy body dementia or posterior cortical atrophy syndrome.   Impression/Diagnosis:   Overall, the results of the current neuropsychological evaluation do show consistencies between available medical information and medical history, subjective reports by the patient and his wife and obtained neuropsychological test performance.  While I was not able to get previous neuropsychological test data to review the descriptions of his performance on previous neuropsychological  testing would suggest that there has been progression over the past year which is confirmed by the patient and his wife.  MRI of brain previously has identified moderate temporal lobe atrophy with progression over the past 4 years and mild overall atrophy with mild chronic small vessel ischemic disease noted going back to 2017 but not significantly progressed over repeated MRIs.  The patient did have a PET metabolic brain scan conducted in 2023 with bilateral hypometabolism noted greater left than right.  There was also relative decreased metabolism within the superior left occipital lobe noted as well.  Radiological interpretation of the PET scan were not viewed as typically consistent with patterns seen with Alzheimer's type dementia and concern for significant Lewy body dementia or postural cortical atrophy syndrome noted with this study.  The obtain neuropsychological test performance is also not what is typically associated with progressive Alzheimer's type dementia process.  Significant motor involvement including motor involvement presenting as a very early symptom and now significant visual processing and changes in fluid reasoning skills are noted and significant profound deficits  for both auditory and visual encoding capacity are noted.  The patient does not report any visual hallucinations or delusions but does note significant tremor and for symptoms noted were generally motor in nature.  Patient is now displaying significant deficits for lexical and semantic verbal fluency, significant changes in executive functioning, significant decrease in information processing speed and significant and profound deficits with regard to his capacity to learn new visual and auditory information.  The pattern of cognitive strict and weaknesses would go beyond those typically seen with postural cortical atrophy syndrome.  Diagnostically relevant information would also go beyond those typically seen with frontotemporal  dementia as well.  The pattern of cognitive strengths and weaknesses, obtained subjective reports of symptoms and presentation timelines and MRI/PET scans would be most consistent with Lewy body dementia.  The patient does clearly have a progressive dementia process going on with steady decline over the past 4 years or more.  As far as treatment recommendations, the patient and his wife are already working on home renovations to adapt to his progressive changes which are clearly needed.  Given the fact that this is likely a progressive process and he will continue to decline these adaptations will likely prove inadequate with time as well.  Life planning for how the patient and his family will manage the progressive nature of his condition will be important and I will spend time during the feedback session going over specific questions they may have and recommendations for how to proceed.  The patient has an excellent neurology team led by Alexander Cummings with specialist within the neurology department consulting on specific aspects of his condition.  I will defer to her for how she wants to proceed from a medical standpoint regarding the patient's progressive neurological condition.  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 Clinical Neuropsychologist

## 2023-05-04 DIAGNOSIS — M542 Cervicalgia: Secondary | ICD-10-CM | POA: Diagnosis not present

## 2023-05-04 DIAGNOSIS — M79669 Pain in unspecified lower leg: Secondary | ICD-10-CM | POA: Diagnosis not present

## 2023-05-09 ENCOUNTER — Encounter: Payer: Self-pay | Admitting: Neurology

## 2023-05-10 ENCOUNTER — Other Ambulatory Visit: Payer: Self-pay | Admitting: Neurology

## 2023-05-10 DIAGNOSIS — F02B18 Dementia in other diseases classified elsewhere, moderate, with other behavioral disturbance: Secondary | ICD-10-CM

## 2023-05-10 DIAGNOSIS — G309 Alzheimer's disease, unspecified: Secondary | ICD-10-CM

## 2023-05-10 DIAGNOSIS — R9089 Other abnormal findings on diagnostic imaging of central nervous system: Secondary | ICD-10-CM

## 2023-05-10 DIAGNOSIS — R4701 Aphasia: Secondary | ICD-10-CM

## 2023-05-11 DIAGNOSIS — M542 Cervicalgia: Secondary | ICD-10-CM | POA: Diagnosis not present

## 2023-05-11 DIAGNOSIS — M79669 Pain in unspecified lower leg: Secondary | ICD-10-CM | POA: Diagnosis not present

## 2023-05-11 NOTE — Therapy (Signed)
OUTPATIENT SPEECH LANGUAGE PATHOLOGY EVALUATION   Patient Name: Alexander Cummings MRN: [id number] DOB:Apr 01, 1946, 77 y.o., male Today's Date: 05/12/2023  PCP: Johny Blamer, MDPCP - General  REFERRING PROVIDER: Dohmeier, Porfirio Mylar, [facility] Provider   END OF SESSION:  End of Session - 05/12/23 1125     Visit Number 1    Number of Visits 17    Date for SLP Re-Evaluation 07/07/23    SLP Start Time 0930    SLP Stop Time  1015    SLP Time Calculation (min) 45 min    Activity Tolerance Patient tolerated treatment well             Past Medical History:  Diagnosis Date   Adhesive capsulitis of right shoulder 06/19/2019   BCC (basal cell carcinoma)    face   Carpal tunnel syndrome    right wrist   Kidney stones    Osteoarthritis    back, knees, neck   Recurrent upper respiratory infection (URI)    gets colds infrequently...   Tinea versicolor    Past Surgical History:  Procedure Laterality Date   COLONOSCOPY     LUMBAR LAMINECTOMY/DECOMPRESSION MICRODISCECTOMY  10/03/2011   Procedure: LUMBAR LAMINECTOMY/DECOMPRESSION MICRODISCECTOMY;  Surgeon: Hewitt Shorts;  Location: [facility];  Service: Neurosurgery;  Laterality: Left;  LEFT Lumbar four-five EXTRAFORAMINAL MICRODISKECTOMY   MOHS SURGERY  01/2014   right ctr  01/23/2019   TONSILLECTOMY     VASECTOMY     Patient Active Problem List   Diagnosis Date Noted   Dystonia of right hand 07/11/2022   Dementia (HCC) 07/11/2022   Aphasia determined by examination 12/10/2019   Dementia with behavioral disturbance (HCC) 12/10/2019   MRI of brain abnormal 10/29/2019   Generalized hyperreflexia 10/29/2019   Clumsiness on examination 10/29/2019   Apraxia 10/29/2019   Aphasia 10/29/2019   MCI (mild cognitive impairment) with memory loss 10/29/2019    ONSET DATE: 05/10/23   REFERRING DIAG:  R47.01 (ICD-10-CM) - Aphasia determined by examination  G30.9 (ICD-10-CM) - Alzheimer's disease, unspecified (CODE) (HCC)  R90.89  (ICD-10-CM) - MRI of brain abnormal  G31.09,F02.B18 (ICD-10-CM) - 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 (HCC)    THERAPY DIAG:  Dysphagia, oropharyngeal phase - Plan: SLP modified barium swallow  Cognitive communication deficit  Aphasia  Rationale for Evaluation and Treatment: Habilitation  SUBJECTIVE:   SUBJECTIVE STATEMENT: "Too early to tell."  Pt accompanied by: significant other  PERTINENT HISTORY: Patient is a retired Pharmacist, hospital, 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 No  FALLS: Has patient fallen in last 6 months?  No  LIVING ENVIRONMENT: Lives with: lives with their family and lives with their spouse Lives in: House/apartment  PLOF:  Level of assistance: Needed assistance with ADLs, Needed assistance with IADLS Employment: Retired  PATIENT GOALS: Spouse has concerns in swallowing progression, word finding.   OBJECTIVE:   DIAGNOSTIC FINDINGS:   04/12/23 Neuropsych progress note:  Patient and  wife report that 4 years ago they began noting more changes in his overall gait and the fact that the patient is now using his left hand almost exclusively.  Tremors were noted to have begun roughly 4 to 5 years ago even when at the time he was rather healthy overall.  The patient denies any significant pain.  The patient has lost roughly 40 pounds of weight over the past several years without purposeful attempts  at weight loss.  He is now quite thin and frail.  The patient is having increasing difficulties with comprehension and understanding, executive functioning, visual-spatial awareness, word finding and word recall, expressive language changes, short-term memory difficulties and periods of confusion.  He is having increased difficulties managing ADLs particularly due to motor functioning and is overall weak and tends to be repetitive in thoughts and statements.  Essentially symptoms began to present roughly 4 years ago with the patient having MRI, PET scan and previous neuropsychological testing consistent with left brain atrophy, which is also consistent with right arm motor functioning and expressive language changes.  These changes are described as a gradual decline in capabilities that has been rather steady in change.  One of the biggest changes has been changes in his expressive language with significant difficulties with word finding.  Consistencies of difficulties and day-to-day functioning are typical although when he has a change of scenery and is away from his home he may have significant worsening of symptoms.   COGNITION: Overall cognitive status: Impaired Areas of impairment:  Attention: Impaired: Sustained, Alternating, Divided Memory: Impaired: Long term Procedural Awareness: Impaired: Neglect Executive function: Impaired: Error awareness Functional deficits: cognitive deficits affecting IDLs-wife. Patient demonstrates awareness of cognitive loss, but will perseverate on reasons as to why he thinks this has occurred.    AUDITORY COMPREHENSION: Overall auditory comprehension: Impaired: simple, moderately complex, and complex YES/NO questions: Appears intact Following directions: Impaired: simple, moderately complex, and complex Conversation: Simple, Complex, and Moderately Complex Interfering components: attention, awareness, hearing, and processing speed Effective technique: pausing,  repetition/stressing words, slowed speech, written cues, and stressing words  EXPRESSION: verbal  VERBAL EXPRESSION: Level of generative/spontaneous verbalization: word and phrase Repetition: Appears intact Pragmatics: Appears intact Interfering components:  Progressive Lewy body  Effective technique: semantic cues, sentence completion, phonemic cues, written cues, and melodic intonation Non-verbal means of communication: N/A  PERCEPTUAL VOICE ASSESSMENT: Voice quality: hoarse and low vocal intensity Resonance: normal Respiratory function: diaphragmatic/abdominal breathing   STANDARDIZED ASSESSMENTS: Standardized assessments not given at this time due to extensive cognitive testing completed 04/22/23  PATIENT REPORTED OUTCOME MEASURES (PROM): Cognitive Function: Rated as "cannot do" in almost all areas including: keeping track of time, checking for financial documents, planning/managing household chores.    TODAY'S TREATMENT:  DATE:   05/12/23: Patient spouse states concerns in swallowing and word finding. Wants to find tools to put in place before things progress. Patient presented with difficulty word finding, word recall, and repetition. HEP given to create list of personally relevant information to create communication aids. Patient failed 3 oz swallow test by taking multiple sips. MBSS ordered with education provided on the test. Patient struggled following multi-step instructions and required max-A of repetition, immediate feedback, and visual and phonemic cues. Speak Out! Book ordered to address vocal volume and cognitive exercises.   PATIENT EDUCATION: Education details: see above.  Person educated: Patient and Spouse Education method: Explanation Education comprehension: verbalized understanding   GOALS: Goals reviewed with patient?  Yes  SHORT TERM GOALS: Target date: 06/09/23  Patient will find 6 family members names with AAC/external aid and occasional mod-A.  Baseline: Goal status: INITIAL  2.  Patient will use scripts for phones and functional conversation with occasional mod-A.  Baseline:  Goal status: INITIAL  3.  Patient will completed MBSS.  Baseline: Fail of 3 oz swallow test. Goal status: INITIAL  4.  Pt will name 8 items in personally relevant category with AAC/external aids and occasional mod A Baseline:  Goal status: INITIAL   LONG TERM GOALS: Target date: 07/07/23  Patient will use multi-modal communication to take three turns in a conversation with occasional min-A.  Baseline:  Goal status: INITIAL  2.  Patient will use multi-modal communication to make choices with occasional min-A  Baseline:  Goal status: INITIAL  3.  Family will implement strategies, AAC to provide extra time, verbal cues to support pt's communication and participation in conversations with min-A.  Baseline:  Goal status: INITIAL  4.  Patient and family will use external memory aids to complete ADLs with min-A.  Baseline:  Goal status: INITIAL   ASSESSMENT:  CLINICAL IMPRESSION: Patient is a 77 y.o. M who was seen today for a cognition and language assessment. Neuropsych evaluation completed on 04/12/23 states patient is now displaying significant deficits for lexical and semantic verbal fluency, significant changes in executive functioning, significant decrease in information processing speed and significant and profound deficits with regard to his capacity to learn new visual and auditory information. struggles with verbal comprehension, perceptual reasoning, working memory and processing speed (all rated extremely low). Based on PROM, patient struggles with management and planning on household responsibilities, remembering names of familiar names of family members and friend, and following simple and complex  instructions. ST is recommended at this time to address cognitive decline and to put tools in place before his progression worsens, and to support family and caregivers.    OBJECTIVE IMPAIRMENTS: include attention, memory, awareness, executive functioning, expressive language, receptive language, and dysphagia. These impairments are limiting patient from ADLs/IADLs, effectively communicating at home and in community, and safety when swallowing. Factors affecting potential to achieve goals and functional outcome are severity of impairments. Patient will benefit from skilled SLP services to address above impairments and improve overall function.  REHAB POTENTIAL: Poor due to progressive disease.   PLAN:  SLP FREQUENCY: 2x/week  SLP DURATION: 8 weeks  PLANNED INTERVENTIONS: Aspiration precaution training, Environmental controls, Cognitive reorganization, Internal/external aids, Multimodal communication approach, SLP instruction and feedback, and Compensatory strategies    Lovvorn, Radene Journey, CCC-SLP 05/12/2023, 11:26 AM

## 2023-05-12 ENCOUNTER — Encounter: Payer: Self-pay | Admitting: Speech Pathology

## 2023-05-12 ENCOUNTER — Ambulatory Visit: Payer: PPO | Attending: Neurology | Admitting: Speech Pathology

## 2023-05-12 ENCOUNTER — Other Ambulatory Visit: Payer: Self-pay

## 2023-05-12 ENCOUNTER — Telehealth (HOSPITAL_COMMUNITY): Payer: Self-pay

## 2023-05-12 ENCOUNTER — Other Ambulatory Visit (HOSPITAL_COMMUNITY): Payer: Self-pay

## 2023-05-12 DIAGNOSIS — R1310 Dysphagia, unspecified: Secondary | ICD-10-CM | POA: Diagnosis not present

## 2023-05-12 DIAGNOSIS — R1312 Dysphagia, oropharyngeal phase: Secondary | ICD-10-CM | POA: Diagnosis not present

## 2023-05-12 DIAGNOSIS — R41841 Cognitive communication deficit: Secondary | ICD-10-CM | POA: Diagnosis not present

## 2023-05-12 DIAGNOSIS — F02B18 Dementia in other diseases classified elsewhere, moderate, with other behavioral disturbance: Secondary | ICD-10-CM | POA: Insufficient documentation

## 2023-05-12 DIAGNOSIS — G3109 Other frontotemporal dementia: Secondary | ICD-10-CM | POA: Diagnosis not present

## 2023-05-12 DIAGNOSIS — G309 Alzheimer's disease, unspecified: Secondary | ICD-10-CM | POA: Diagnosis not present

## 2023-05-12 DIAGNOSIS — R9089 Other abnormal findings on diagnostic imaging of central nervous system: Secondary | ICD-10-CM | POA: Insufficient documentation

## 2023-05-12 DIAGNOSIS — R4701 Aphasia: Secondary | ICD-10-CM | POA: Insufficient documentation

## 2023-05-12 NOTE — Telephone Encounter (Signed)
Attempted to contact patient to schedule OP Modified Barium Swallow - left voicemail. 

## 2023-05-15 NOTE — Addendum Note (Signed)
Addended by: Ellen Henri on: 05/15/2023 08:52 AM   Modules accepted: Orders

## 2023-05-16 ENCOUNTER — Ambulatory Visit: Payer: PPO | Admitting: Speech Pathology

## 2023-05-16 DIAGNOSIS — R41841 Cognitive communication deficit: Secondary | ICD-10-CM

## 2023-05-16 DIAGNOSIS — M79669 Pain in unspecified lower leg: Secondary | ICD-10-CM | POA: Diagnosis not present

## 2023-05-16 DIAGNOSIS — R1310 Dysphagia, unspecified: Secondary | ICD-10-CM

## 2023-05-16 DIAGNOSIS — M542 Cervicalgia: Secondary | ICD-10-CM | POA: Diagnosis not present

## 2023-05-16 DIAGNOSIS — R1312 Dysphagia, oropharyngeal phase: Secondary | ICD-10-CM | POA: Diagnosis not present

## 2023-05-16 NOTE — Therapy (Signed)
OUTPATIENT SPEECH LANGUAGE PATHOLOGY EVALUATION   Patient Name: Alexander Cummings MRN: [id number] DOB:[birth year], 77 y.o., male Today's Date: 05/16/2023  PCP: Johny Blamer, MDPCP - General  REFERRING PROVIDER: Dohmeier, Porfirio Mylar, [facility] Provider   END OF SESSION:  End of Session - 05/16/23 1322     Visit Number 2    Number of Visits 17    Date for SLP Re-Evaluation 07/07/23    SLP Start Time 1323    SLP Stop Time  1408    SLP Time Calculation (min) 45 min    Activity Tolerance Patient tolerated treatment well              Past Medical History:  Diagnosis Date   Adhesive capsulitis of right shoulder 06/19/2019   BCC (basal cell carcinoma)    face   Carpal tunnel syndrome    right wrist   Kidney stones    Osteoarthritis    back, knees, neck   Recurrent upper respiratory infection (URI)    gets colds infrequently...   Tinea versicolor    Past Surgical History:  Procedure Laterality Date   COLONOSCOPY     LUMBAR LAMINECTOMY/DECOMPRESSION MICRODISCECTOMY  10/03/2011   Procedure: LUMBAR LAMINECTOMY/DECOMPRESSION MICRODISCECTOMY;  Surgeon: Hewitt Shorts;  Location: [facility];  Service: Neurosurgery;  Laterality: Left;  LEFT Lumbar four-five EXTRAFORAMINAL MICRODISKECTOMY   MOHS SURGERY  01/2014   right ctr  01/23/2019   TONSILLECTOMY     VASECTOMY     Patient Active Problem List   Diagnosis Date Noted   Dystonia of right hand 07/11/2022   Dementia (HCC) 07/11/2022   Aphasia determined by examination 12/10/2019   Dementia with behavioral disturbance (HCC) 12/10/2019   MRI of brain abnormal 10/29/2019   Generalized hyperreflexia 10/29/2019   Clumsiness on examination 10/29/2019   Apraxia 10/29/2019   Aphasia 10/29/2019   MCI (mild cognitive impairment) with memory loss 10/29/2019    ONSET DATE: 05/10/23   REFERRING DIAG:  R47.01 (ICD-10-CM) - Aphasia determined by examination  G30.9 (ICD-10-CM) - Alzheimer's disease, unspecified (CODE) (HCC)  R90.89  (ICD-10-CM) - MRI of brain abnormal  G31.09,F02.B18 (ICD-10-CM) - 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 (HCC)    THERAPY DIAG:  Dysphagia, unspecified type  Cognitive communication deficit  Rationale for Evaluation and Treatment: Habilitation  SUBJECTIVE:   SUBJECTIVE STATEMENT: "About the same"--wife Pt accompanied by: significant other  PERTINENT HISTORY: Patient is a retired Pharmacist, hospital, 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 No  FALLS: Has patient fallen in last 6 months?  No  LIVING ENVIRONMENT: Lives with: lives with their family and lives with their spouse Lives in: House/apartment  PLOF:  Level of assistance: Needed assistance with ADLs, Needed assistance with IADLS Employment: Retired  PATIENT GOALS: Spouse has concerns in swallowing progression, word finding.   OBJECTIVE:   DIAGNOSTIC FINDINGS:   04/12/23 Neuropsych progress note:  Patient and wife report that 4 years ago they began noting  more changes in his overall gait and the fact that the patient is now using his left hand almost exclusively.  Tremors were noted to have begun roughly 4 to 5 years ago even when at the time he was rather healthy overall.  The patient denies any significant pain.  The patient has lost roughly 40 pounds of weight over the past several years without purposeful attempts at weight loss.  He is now quite thin and frail.   The patient is having increasing difficulties with comprehension and understanding, executive functioning, visual-spatial awareness, word finding and word recall, expressive language changes, short-term memory difficulties and periods of confusion.  He is having increased difficulties managing ADLs particularly due to motor functioning and is overall weak and tends to be repetitive in thoughts and statements.  Essentially symptoms began to present roughly 4 years ago with the patient having MRI, PET scan and previous neuropsychological testing consistent with left brain atrophy, which is also consistent with right arm motor functioning and expressive language changes.  These changes are described as a gradual decline in capabilities that has been rather steady in change.  One of the biggest changes has been changes in his expressive language with significant difficulties with word finding.  Consistencies of difficulties and day-to-day functioning are typical although when he has a change of scenery and is away from his home he may have significant worsening of symptoms.   COGNITION: Overall cognitive status: Impaired Areas of impairment:  Attention: Impaired: Sustained, Alternating, Divided Memory: Impaired: Long term Procedural Awareness: Impaired: Neglect Executive function: Impaired: Error awareness Functional deficits: cognitive deficits affecting IDLs-wife. Patient demonstrates awareness of cognitive loss, but will perseverate on reasons as to why he thinks this has occurred.    AUDITORY COMPREHENSION: Overall auditory comprehension: Impaired: simple, moderately complex, and complex YES/NO questions: Appears intact Following directions: Impaired: simple, moderately complex, and complex Conversation: Simple, Complex, and Moderately Complex Interfering components: attention, awareness, hearing, and processing speed Effective technique: pausing, repetition/stressing words, slowed speech, written  cues, and stressing words  EXPRESSION: verbal  VERBAL EXPRESSION: Level of generative/spontaneous verbalization: word and phrase Repetition: Appears intact Pragmatics: Appears intact Interfering components:  Progressive Lewy body  Effective technique: semantic cues, sentence completion, phonemic cues, written cues, and melodic intonation Non-verbal means of communication: N/A  PERCEPTUAL VOICE ASSESSMENT: Voice quality: hoarse and low vocal intensity Resonance: normal Respiratory function: diaphragmatic/abdominal breathing   STANDARDIZED ASSESSMENTS: Standardized assessments not given at this time due to extensive cognitive testing completed 04/22/23  PATIENT REPORTED OUTCOME MEASURES (PROM): Cognitive Function: Rated as "cannot do" in almost all areas including: keeping track of time, checking for financial documents, planning/managing household chores.    TODAY'S TREATMENT:                                                                                                                                         DATE:  05/16/23: Patient's wife returned with lists of frequently used/functional words. SLP educated on purpose of communication book and started making with information provided. Patient able to talk through favorite restaurants and what he likes in each one. All questions answered to patient/spouse satisfaction. HEP sent home with start of communication book, and to practice at home.   05/12/23: Patient spouse states concerns in swallowing and word finding. Wants to find tools to put in place before things progress. Patient presented with difficulty word finding, word recall, and repetition. HEP given to create list of personally relevant information to create communication aids. Patient failed 3 oz swallow test by taking multiple sips. MBSS ordered with education provided on the test. Patient struggled following multi-step instructions and required max-A of repetition, immediate  feedback, and visual and phonemic cues. Speak Out! Book ordered to address vocal volume and cognitive exercises.   PATIENT EDUCATION: Education details: see above.  Person educated: Patient and Spouse Education method: Explanation Education comprehension: verbalized understanding   GOALS: Goals reviewed with patient? Yes  SHORT TERM GOALS: Target date: 06/09/23  Patient will find 6 family members names with AAC/external aid and occasional mod-A.  Baseline: Goal status: IN PROGRESS  2.  Patient will use scripts for phones and functional conversation with occasional mod-A.  Baseline:  Goal status: IN PROGRESS  3.  Patient will completed MBSS.  Baseline: Fail of 3 oz swallow test. Goal status: IN PROGRESS  4.  Pt will name 8 items in personally relevant category with AAC/external aids and occasional mod A Baseline:  Goal status: IN PROGRESS   LONG TERM GOALS: Target date: 07/07/23  Patient will use multi-modal communication to take three turns in a conversation with occasional min-A.  Baseline:  Goal status: IN PROGRESS  2.  Patient will use multi-modal communication to make choices with occasional min-A  Baseline:  Goal status: IN PROGRESS  3.  Family will implement strategies, AAC to provide extra time, verbal cues to support pt's communication and participation in conversations with min-A.  Baseline:  Goal status: IN PROGRESS  4.  Patient and family will use external memory aids to complete ADLs with min-A.  Baseline:  Goal status: IN PROGRESS   ASSESSMENT:  CLINICAL IMPRESSION: Patient is a 77 y.o. M who was seen today for a cognition and language assessment. Neuropsych evaluation completed on 04/12/23 states patient is now displaying significant deficits for lexical and semantic verbal fluency, significant changes in executive functioning, significant decrease in information processing speed and significant and profound deficits with regard to his capacity to learn  new visual and auditory information. struggles with verbal comprehension, perceptual reasoning, working memory and processing speed (all rated extremely low). Based on PROM, patient struggles with management and planning on household responsibilities, remembering names of familiar names of family members and friend, and following simple and complex instructions. ST is recommended at this time to address cognitive decline and to put tools in place before his progression worsens, and to support family and caregivers.    OBJECTIVE IMPAIRMENTS: include attention, memory, awareness, executive functioning, expressive language, receptive language, and dysphagia. These impairments are limiting patient from ADLs/IADLs, effectively communicating at home and in community, and safety when swallowing. Factors affecting potential to achieve goals and functional outcome are severity of impairments. Patient will benefit from skilled SLP services to address above impairments and improve overall function.  REHAB POTENTIAL: Poor due to progressive disease.   PLAN:  SLP FREQUENCY: 2x/week  SLP DURATION: 8 weeks  PLANNED  INTERVENTIONS: Aspiration precaution training, Environmental controls, Cognitive reorganization, Internal/external aids, Multimodal communication approach, SLP instruction and feedback, and Compensatory strategies    [place], Student-SLP 05/16/2023, 1:23 PM

## 2023-05-16 NOTE — Patient Instructions (Signed)
Pictures for:

## 2023-05-17 DIAGNOSIS — F02B0 Dementia in other diseases classified elsewhere, moderate, without behavioral disturbance, psychotic disturbance, mood disturbance, and anxiety: Secondary | ICD-10-CM | POA: Diagnosis not present

## 2023-05-17 DIAGNOSIS — E7800 Pure hypercholesterolemia, unspecified: Secondary | ICD-10-CM | POA: Diagnosis not present

## 2023-05-17 DIAGNOSIS — L821 Other seborrheic keratosis: Secondary | ICD-10-CM | POA: Diagnosis not present

## 2023-05-17 DIAGNOSIS — G3183 Dementia with Lewy bodies: Secondary | ICD-10-CM | POA: Diagnosis not present

## 2023-05-17 DIAGNOSIS — N3281 Overactive bladder: Secondary | ICD-10-CM | POA: Diagnosis not present

## 2023-05-17 DIAGNOSIS — E46 Unspecified protein-calorie malnutrition: Secondary | ICD-10-CM | POA: Diagnosis not present

## 2023-05-17 DIAGNOSIS — M25561 Pain in right knee: Secondary | ICD-10-CM | POA: Diagnosis not present

## 2023-05-17 DIAGNOSIS — F5101 Primary insomnia: Secondary | ICD-10-CM | POA: Diagnosis not present

## 2023-05-17 DIAGNOSIS — L989 Disorder of the skin and subcutaneous tissue, unspecified: Secondary | ICD-10-CM | POA: Diagnosis not present

## 2023-05-18 ENCOUNTER — Telehealth (HOSPITAL_COMMUNITY): Payer: Self-pay | Admitting: *Deleted

## 2023-05-18 ENCOUNTER — Ambulatory Visit: Payer: PPO | Admitting: Speech Pathology

## 2023-05-18 DIAGNOSIS — M542 Cervicalgia: Secondary | ICD-10-CM | POA: Diagnosis not present

## 2023-05-18 DIAGNOSIS — R1312 Dysphagia, oropharyngeal phase: Secondary | ICD-10-CM | POA: Diagnosis not present

## 2023-05-18 DIAGNOSIS — R1310 Dysphagia, unspecified: Secondary | ICD-10-CM

## 2023-05-18 DIAGNOSIS — R4701 Aphasia: Secondary | ICD-10-CM

## 2023-05-18 DIAGNOSIS — R41841 Cognitive communication deficit: Secondary | ICD-10-CM

## 2023-05-18 DIAGNOSIS — M79669 Pain in unspecified lower leg: Secondary | ICD-10-CM | POA: Diagnosis not present

## 2023-05-18 NOTE — Therapy (Signed)
OUTPATIENT SPEECH LANGUAGE PATHOLOGY TREATMENT   Patient Name: Alexander Cummings MRN: [id number] DOB:[birth year], 77 y.o., male Today's Date: 05/18/2023  PCP: Johny Blamer, MDPCP - General  REFERRING PROVIDER: Dohmeier, Porfirio Mylar, [facility] Provider   END OF SESSION:  End of Session - 05/18/23 1321     Visit Number 3    Number of Visits 17    Date for SLP Re-Evaluation 07/07/23    SLP Start Time 1322    SLP Stop Time  1407    SLP Time Calculation (min) 45 min    Activity Tolerance Patient tolerated treatment well               Past Medical History:  Diagnosis Date   Adhesive capsulitis of right shoulder 06/19/2019   BCC (basal cell carcinoma)    face   Carpal tunnel syndrome    right wrist   Kidney stones    Osteoarthritis    back, knees, neck   Recurrent upper respiratory infection (URI)    gets colds infrequently...   Tinea versicolor    Past Surgical History:  Procedure Laterality Date   COLONOSCOPY     LUMBAR LAMINECTOMY/DECOMPRESSION MICRODISCECTOMY  10/03/2011   Procedure: LUMBAR LAMINECTOMY/DECOMPRESSION MICRODISCECTOMY;  Surgeon: Hewitt Shorts;  Location: [facility];  Service: Neurosurgery;  Laterality: Left;  LEFT Lumbar four-five EXTRAFORAMINAL MICRODISKECTOMY   MOHS SURGERY  01/2014   right ctr  01/23/2019   TONSILLECTOMY     VASECTOMY     Patient Active Problem List   Diagnosis Date Noted   Dystonia of right hand 07/11/2022   Dementia (HCC) 07/11/2022   Aphasia determined by examination 12/10/2019   Dementia with behavioral disturbance (HCC) 12/10/2019   MRI of brain abnormal 10/29/2019   Generalized hyperreflexia 10/29/2019   Clumsiness on examination 10/29/2019   Apraxia 10/29/2019   Aphasia 10/29/2019   MCI (mild cognitive impairment) with memory loss 10/29/2019    ONSET DATE: 05/10/23   REFERRING DIAG:  R47.01 (ICD-10-CM) - Aphasia determined by examination  G30.9 (ICD-10-CM) - Alzheimer's disease, unspecified (CODE) (HCC)  R90.89  (ICD-10-CM) - MRI of brain abnormal  G31.09,F02.B18 (ICD-10-CM) - 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 (HCC)    THERAPY DIAG:  No diagnosis found.  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 No  FALLS: Has patient fallen in last 6 months?  No  LIVING ENVIRONMENT: Lives with: lives with their family and lives with their spouse Lives in: House/apartment  PLOF:  Level of assistance: Needed assistance with ADLs, Needed assistance with IADLS Employment: Retired  PATIENT GOALS: Spouse has concerns in swallowing progression, word finding.   OBJECTIVE:   DIAGNOSTIC FINDINGS:   04/12/23 Neuropsych progress note:  Patient and wife report that 4 years ago they began noting more changes in his  overall gait and the fact that the patient is now using his left hand almost exclusively.  Tremors were noted to have begun roughly 4 to 5 years ago even when at the time he was rather healthy overall.  The patient denies any significant pain.  The patient has lost roughly 40 pounds of weight over the past several years without purposeful attempts at weight loss.  He is now quite thin and frail.  The patient is having increasing difficulties with  comprehension and understanding, executive functioning, visual-spatial awareness, word finding and word recall, expressive language changes, short-term memory difficulties and periods of confusion.  He is having increased difficulties managing ADLs particularly due to motor functioning and is overall weak and tends to be repetitive in thoughts and statements.  Essentially symptoms began to present roughly 4 years ago with the patient having MRI, PET scan and previous neuropsychological testing consistent with left brain atrophy, which is also consistent with right arm motor functioning and expressive language changes.  These changes are described as a gradual decline in capabilities that has been rather steady in change.  One of the biggest changes has been changes in his expressive language with significant difficulties with word finding.  Consistencies of difficulties and day-to-day functioning are typical although when he has a change of scenery and is away from his home he may have significant worsening of symptoms.   COGNITION: Overall cognitive status: Impaired Areas of impairment:  Attention: Impaired: Sustained, Alternating, Divided Memory: Impaired: Long term Procedural Awareness: Impaired: Neglect Executive function: Impaired: Error awareness Functional deficits: cognitive deficits affecting IDLs-wife. Patient demonstrates awareness of cognitive loss, but will perseverate on reasons as to why he thinks this has occurred.    AUDITORY COMPREHENSION: Overall auditory comprehension: Impaired: simple, moderately complex, and complex YES/NO questions: Appears intact Following directions: Impaired: simple, moderately complex, and complex Conversation: Simple, Complex, and Moderately Complex Interfering components: attention, awareness, hearing, and processing speed Effective technique: pausing, repetition/stressing words, slowed speech, written cues, and stressing words  EXPRESSION:  verbal  VERBAL EXPRESSION: Level of generative/spontaneous verbalization: word and phrase Repetition: Appears intact Pragmatics: Appears intact Interfering components:  Progressive Lewy body  Effective technique: semantic cues, sentence completion, phonemic cues, written cues, and melodic intonation Non-verbal means of communication: N/A  PERCEPTUAL VOICE ASSESSMENT: Voice quality: hoarse and low vocal intensity Resonance: normal Respiratory function: diaphragmatic/abdominal breathing   STANDARDIZED ASSESSMENTS: Standardized assessments not given at this time due to extensive cognitive testing completed 04/22/23  PATIENT REPORTED OUTCOME MEASURES (PROM): Cognitive Function: Rated as "cannot do" in almost all areas including: keeping track of time, checking for financial documents, planning/managing household chores.    TODAY'S TREATMENT:                                                                                                                                         DATE:   05/18/23: Patient's wife came  with personal pictures to help create personalized AAC device. SLP created family pages with names for each family member. Patient required frequent mod-A of semantic, visual, and phonemic cues to add in recall of family member names. Patient previously created emergency contact card that he carries with him on his phone. Patient able to practice with with low-tech AAC provided today which increased accuracy of word recall. Patient tolerated treatment well and thanked SLP for help. HEP of collecting more pictures with wife will email. Provided wife with phone number to reschedule MBSS to earlier date.   05/16/23: Patient's wife returned with lists of frequently used/functional words. SLP educated on purpose of communication book and started making with information provided. Patient able to talk through favorite restaurants and what he likes in each one. All questions answered to  patient/spouse satisfaction. HEP sent home with start of communication book, and to practice at home.   05/12/23: Patient spouse states concerns in swallowing and word finding. Wants to find tools to put in place before things progress. Patient presented with difficulty word finding, word recall, and repetition. HEP given to create list of personally relevant information to create communication aids. Patient failed 3 oz swallow test by taking multiple sips. MBSS ordered with education provided on the test. Patient struggled following multi-step instructions and required max-A of repetition, immediate feedback, and visual and phonemic cues. Speak Out! Book ordered to address vocal volume and cognitive exercises.   PATIENT EDUCATION: Education details: see above.  Person educated: Patient and Spouse Education method: Explanation Education comprehension: verbalized understanding   GOALS: Goals reviewed with patient? Yes  SHORT TERM GOALS: Target date: 06/09/23  Patient will find 6 family members names with AAC/external aid and occasional mod-A.  Baseline: Goal status: IN PROGRESS  2.  Patient will use scripts for phones and functional conversation with occasional mod-A.  Baseline:  Goal status: IN PROGRESS  3.  Patient will completed MBSS.  Baseline: Fail of 3 oz swallow test. Goal status: IN PROGRESS  4.  Pt will name 8 items in personally relevant category with AAC/external aids and occasional mod A Baseline:  Goal status: IN PROGRESS   LONG TERM GOALS: Target date: 07/07/23  Patient will use multi-modal communication to take three turns in a conversation with occasional min-A.  Baseline:  Goal status: IN PROGRESS  2.  Patient will use multi-modal communication to make choices with occasional min-A  Baseline:  Goal status: IN PROGRESS  3.  Family will implement strategies, AAC to provide extra time, verbal cues to support pt's communication and participation in conversations  with min-A.  Baseline:  Goal status: IN PROGRESS  4.  Patient and family will use external memory aids to complete ADLs with min-A.  Baseline:  Goal status: IN PROGRESS   ASSESSMENT:  CLINICAL IMPRESSION: Patient is a 77 y.o. M who was seen today for a cognition and language assessment. Neuropsych evaluation completed on 04/12/23 states patient is now displaying significant deficits for lexical and semantic verbal fluency, significant changes in executive functioning, significant decrease in information processing speed and significant and profound deficits with regard to his capacity to learn new visual and auditory information. struggles with verbal comprehension, perceptual reasoning, working memory and processing speed (all rated extremely low). Based on PROM, patient struggles with management and planning on household responsibilities, remembering names of familiar names of family members and friend, and following simple and complex instructions. ST is recommended at this time to address cognitive decline and to put  tools in place before his progression worsens, and to support family and caregivers.    OBJECTIVE IMPAIRMENTS: include attention, memory, awareness, executive functioning, expressive language, receptive language, and dysphagia. These impairments are limiting patient from ADLs/IADLs, effectively communicating at home and in community, and safety when swallowing. Factors affecting potential to achieve goals and functional outcome are severity of impairments. Patient will benefit from skilled SLP services to address above impairments and improve overall function.  REHAB POTENTIAL: Poor due to progressive disease.   PLAN:  SLP FREQUENCY: 2x/week  SLP DURATION: 8 weeks  PLANNED INTERVENTIONS: Aspiration precaution training, Environmental controls, Cognitive reorganization, Internal/external aids, Multimodal communication approach, SLP instruction and feedback, and Compensatory  strategies    [place], Student-SLP 05/18/2023, 1:22 PM

## 2023-05-18 NOTE — Telephone Encounter (Signed)
Attempted to contact patient to reschedule for an earlier date. Unable to leave a VM. Will try back as time allows. RKEEL

## 2023-05-19 ENCOUNTER — Ambulatory Visit (HOSPITAL_COMMUNITY)
Admission: RE | Admit: 2023-05-19 | Discharge: 2023-05-19 | Disposition: A | Discharge status: Home / Self Care | Payer: PPO | Source: Ambulatory Visit | Attending: Family Medicine | Admitting: Family Medicine

## 2023-05-19 DIAGNOSIS — R1312 Dysphagia, oropharyngeal phase: Secondary | ICD-10-CM

## 2023-05-19 DIAGNOSIS — G3183 Dementia with Lewy bodies: Secondary | ICD-10-CM | POA: Diagnosis not present

## 2023-05-19 DIAGNOSIS — R634 Abnormal weight loss: Secondary | ICD-10-CM | POA: Diagnosis not present

## 2023-05-19 DIAGNOSIS — R1310 Dysphagia, unspecified: Secondary | ICD-10-CM | POA: Diagnosis present

## 2023-05-19 DIAGNOSIS — F0280 Dementia in other diseases classified elsewhere without behavioral disturbance: Secondary | ICD-10-CM | POA: Diagnosis not present

## 2023-05-19 NOTE — Progress Notes (Signed)
Modified Barium Swallow Study  Patient Details  Name: Alexander Cummings MRN: [id number] Date of Birth: Sep 27, 1946  Today's Date: 05/19/2023  HPI/PMH: HPI: Alexander Cummings is a 77 y.o. male who was referred for OP MBS due to deteriorating swallow function, particularly last six months. Dx Lewy Body Dementia; he is participating in OP SLP and is followed by neurology. He and his wife report intermittent coughing during meals, expectoration of foods, significant weight loss last several months. He is having increased difficulty managing ADLs; continues to walk daily; changes in speech, language, and cognition are notable.   Clinical Impression: Clinical Impression: Alexander Cummings presents with an oropharyngeal dysphagia marked by significant pharyngeal residue and generally silent, trace aspiration of most consistencies.  Notable is weak contracture of pharyngeal musculature, particularly poor pharyngeal stripping and reduced base of tongue propulsion, leading to accumulation of residue particularly in the valleculae and to lesser degree in pyriforms.  Thin viscosities passed more readily through the pharynx and UES, but were more likely to be aspirated. Thicker viscosities were more likely to sit in pharynx.   There was occasional escape of thinner viscosities into the nasopharynx. The epiglottis inverted over larynx intermittently; more often there was NOT full inversion, with tip folded up against pharyngeal walls. Cued cough was weak.  Pt had difficulty executing chin tucks and head turns.  After the study, Mr. and Mrs. viewed the imaging and we discussed concerns for his progressing dysphagia.   Recommendations were discussed and agreed upon, including: 1) obtain Palliative care consult to address complex issues re: nutrition and quality of life; 2) continue OP SLP - consider EMST, improving cough, and work to address pharyngeal/ base of tongue strengthening; 3) continue current diet - avoid tough meats/breads,  consider pureeing foods when too difficult to swallow, add protein shakes, consider nutrition consult; 4) snack frequently throughout the day; 5) continue moving/walking as much as possible. We reviewed risk factors for developing aspiration pna and pt has 2/12 (presence of aspiration and cognitive changes); he was encouraged to maintain physical activity as much as possible. Mr. and [name] agreed with findings/recs. Please feel free to reach out to our dept should there be further questions ([phone]).  Factors that may increase risk of adverse event in presence of aspiration Rubye Oaks [employer] Clearance Coots 2021): Factors that may increase risk of adverse event in presence of aspiration Rubye Oaks & Clearance Coots 2021): Reduced cognitive function; Aspiration of thick, dense, and/or acidic materials   Recommendations/Plan: Swallowing Evaluation Recommendations Swallowing Evaluation Recommendations Recommendations: PO diet PO Diet Recommendation: Dysphagia 3 (Mechanical soft); Thin liquids (Level 0) Liquid Administration via: Cup; Straw Medication Administration: Crushed with puree Supervision: Patient able to self-feed Oral care recommendations: Oral care BID (2x/day) Recommended consults: Consider Palliative care    Treatment Plan Treatment Plan Treatment recommendations: Defer treatment plan to SLP at other venue (see follow-up recommendations) Follow-up recommendations: Outpatient SLP     Recommendations Recommendations for follow up therapy are one component of a multi-disciplinary discharge planning process, led by the attending physician.  Recommendations may be updated based on patient status, additional functional criteria and insurance authorization.  Assessment: Orofacial Exam: Orofacial Exam Oral Cavity - Dentition: Adequate natural dentition Orofacial Anatomy: WFL Oral Motor/Sensory Function: WFL    Anatomy:  Anatomy: WFL   Boluses Administered: Boluses Administered Boluses  Administered: Thin liquids (Level 0); Mildly thick liquids (Level 2, nectar thick); Moderately thick liquids (Level 3, honey thick); Puree     Oral Impairment Domain: Oral Impairment Domain Lip  Closure: No labial escape Tongue control during bolus hold: Cohesive bolus between tongue to palatal seal Bolus preparation/mastication: Slow prolonged chewing/mashing with complete recollection Bolus transport/lingual motion: Brisk tongue motion Oral residue: Complete oral clearance Initiation of pharyngeal swallow : Pyriform sinuses     Pharyngeal Impairment Domain: Pharyngeal Impairment Domain Soft palate elevation: Escape to nasopharynx Laryngeal elevation: Partial superior movement of thyroid cartilage/partial approximation of arytenoids to epiglottic petiole Anterior hyoid excursion: Partial anterior movement Epiglottic movement: Partial inversion Laryngeal vestibule closure: Incomplete, narrow column air/contrast in laryngeal vestibule Pharyngeal stripping wave : Present - diminished Pharyngeal contraction (A/P view only): N/A Pharyngoesophageal segment opening: Partial distention/partial duration, partial obstruction of flow Tongue base retraction: Narrow column of contrast or air between tongue base and PPW Pharyngeal residue: Collection of residue within or on pharyngeal structures Location of pharyngeal residue: Valleculae; Pharyngeal wall; Pyriform sinuses     Esophageal Impairment Domain: Esophageal Impairment Domain Esophageal clearance upright position: -- (n/a)    Pill: Pill Consistency administered: -- (NT)    Penetration/Aspiration Scale Score: Penetration/Aspiration Scale Score 7.  Material enters airway, passes BELOW cords and not ejected out despite cough attempt by patient: Thin liquids (Level 0); Mildly thick liquids (Level 2, nectar thick); Moderately thick liquids (Level 3, honey thick); Puree    Compensatory Strategies: Compensatory  Strategies Compensatory strategies: Yes Straw: Ineffective Ineffective Straw: Thin liquid (Level 0); Mildly thick liquid (Level 2, nectar thick) Chin tuck: Ineffective Ineffective Chin Tuck: Thin liquid (Level 0) Left head turn: Ineffective Ineffective Left Head Turn: Thin liquid (Level 0) Right head turn: Ineffective Ineffective Right Head Turn: Thin liquid (Level 0)       General Information: Caregiver present: Yes   Diet Prior to this Study: Regular; Thin liquids (Level 0)    Temperature : Normal    Respiratory Status: WFL    Supplemental O2: None (Room air)    History of Recent Intubation: No   Behavior/Cognition: Alert; Cooperative; Pleasant mood  Self-Feeding Abilities: Able to self-feed  Baseline vocal quality/speech: Hypophonia/low volume  Volitional Cough: Able to elicit  Volitional Swallow: Able to elicit  Exam Limitations: No limitations   Goal Planning: No data recorded No data recorded No data recorded No data recorded No data recorded  Pain: Pain Assessment Pain Assessment: No/denies pain    End of Session: Start Time:SLP Start Time (ACUTE ONLY): 1136  Stop Time: SLP Stop Time (ACUTE ONLY): 1238  Time Calculation:SLP Time Calculation (min) (ACUTE ONLY): 62 min  Charges: SLP Evaluations $ SLP Speech Visit: 1 Visit  SLP Evaluations $Outpatient MBS Swallow: 1 Procedure   SLP visit diagnosis: No data recorded   Past Medical History:  Past Medical History:  Diagnosis Date   Adhesive capsulitis of right shoulder 06/19/2019   BCC (basal cell carcinoma)    face   Carpal tunnel syndrome    right wrist   Kidney stones    Osteoarthritis    back, knees, neck   Recurrent upper respiratory infection (URI)    gets colds infrequently...   Tinea versicolor    Past Surgical History:  Past Surgical History:  Procedure Laterality Date   COLONOSCOPY     LUMBAR LAMINECTOMY/DECOMPRESSION MICRODISCECTOMY  10/03/2011   Procedure: LUMBAR  LAMINECTOMY/DECOMPRESSION MICRODISCECTOMY;  Surgeon: Hewitt Shorts;  Location: [facility];  Service: Neurosurgery;  Laterality: Left;  LEFT Lumbar four-five EXTRAFORAMINAL MICRODISKECTOMY   MOHS SURGERY  01/2014   right ctr  01/23/2019   TONSILLECTOMY     VASECTOMY  Kalia Vahey L. Samson Frederic, MA CCC/SLP Clinical Specialist - Acute Care SLP Acute Rehabilitation Services Office number [phone]  Blenda Mounts Laurice 05/19/2023, 2:46 PM

## 2023-05-23 ENCOUNTER — Ambulatory Visit: Payer: PPO | Admitting: Speech Pathology

## 2023-05-23 DIAGNOSIS — M542 Cervicalgia: Secondary | ICD-10-CM | POA: Diagnosis not present

## 2023-05-23 DIAGNOSIS — R1312 Dysphagia, oropharyngeal phase: Secondary | ICD-10-CM | POA: Diagnosis not present

## 2023-05-23 DIAGNOSIS — R41841 Cognitive communication deficit: Secondary | ICD-10-CM

## 2023-05-23 DIAGNOSIS — M79669 Pain in unspecified lower leg: Secondary | ICD-10-CM | POA: Diagnosis not present

## 2023-05-23 NOTE — Therapy (Signed)
OUTPATIENT SPEECH LANGUAGE PATHOLOGY TREATMENT   Patient Name: Alexander Cummings MRN: [id number] DOB:1946/09/10, 77 y.o., male Today's Date: 05/23/2023  PCP: Johny Blamer, MDPCP - General  REFERRING PROVIDER: Dohmeier, Porfirio Mylar, [facility] Provider   END OF SESSION:  End of Session - 05/23/23 1232     Visit Number 4    Number of Visits 17    Date for SLP Re-Evaluation 07/07/23    SLP Start Time 1232    SLP Stop Time  1317    SLP Time Calculation (min) 45 min    Activity Tolerance Patient tolerated treatment well                Past Medical History:  Diagnosis Date   Adhesive capsulitis of right shoulder 06/19/2019   BCC (basal cell carcinoma)    face   Carpal tunnel syndrome    right wrist   Kidney stones    Osteoarthritis    back, knees, neck   Recurrent upper respiratory infection (URI)    gets colds infrequently...   Tinea versicolor    Past Surgical History:  Procedure Laterality Date   COLONOSCOPY     LUMBAR LAMINECTOMY/DECOMPRESSION MICRODISCECTOMY  10/03/2011   Procedure: LUMBAR LAMINECTOMY/DECOMPRESSION MICRODISCECTOMY;  Surgeon: Hewitt Shorts;  Location: [facility];  Service: Neurosurgery;  Laterality: Left;  LEFT Lumbar four-five EXTRAFORAMINAL MICRODISKECTOMY   MOHS SURGERY  01/2014   right ctr  01/23/2019   TONSILLECTOMY     VASECTOMY     Patient Active Problem List   Diagnosis Date Noted   Dystonia of right hand 07/11/2022   Dementia (HCC) 07/11/2022   Aphasia determined by examination 12/10/2019   Dementia with behavioral disturbance (HCC) 12/10/2019   MRI of brain abnormal 10/29/2019   Generalized hyperreflexia 10/29/2019   Clumsiness on examination 10/29/2019   Apraxia 10/29/2019   Aphasia 10/29/2019   MCI (mild cognitive impairment) with memory loss 10/29/2019    ONSET DATE: 05/10/23   REFERRING DIAG:  R47.01 (ICD-10-CM) - Aphasia determined by examination  G30.9 (ICD-10-CM) - Alzheimer's disease, unspecified (CODE) (HCC)  R90.89  (ICD-10-CM) - MRI of brain abnormal  G31.09,F02.B18 (ICD-10-CM) - 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 (HCC)    THERAPY DIAG:  Dysphagia, oropharyngeal phase  Cognitive communication deficit  Rationale for Evaluation and Treatment: Habilitation  SUBJECTIVE:   SUBJECTIVE STATEMENT: "Doing okay" Pt accompanied by: significant other  PERTINENT HISTORY: Patient is a retired Pharmacist, hospital, 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 No  FALLS: Has patient fallen in last 6 months?  No  LIVING ENVIRONMENT: Lives with: lives with their family and lives with their spouse Lives in: House/apartment  PLOF:  Level of assistance: Needed assistance with ADLs, Needed assistance with IADLS Employment: Retired  PATIENT GOALS: Spouse has concerns in swallowing progression, word finding.   OBJECTIVE:   DIAGNOSTIC FINDINGS:   04/12/23 Neuropsych progress note:  Patient and wife report that 4 years ago they began  noting more changes in his overall gait and the fact that the patient is now using his left hand almost exclusively.  Tremors were noted to have begun roughly 4 to 5 years ago even when at the time he was rather healthy overall.  The patient denies any significant pain.  The patient has lost roughly 40 pounds of weight over the past several years without purposeful attempts at weight loss.  He is now quite thin and frail.  The  patient is having increasing difficulties with comprehension and understanding, executive functioning, visual-spatial awareness, word finding and word recall, expressive language changes, short-term memory difficulties and periods of confusion.  He is having increased difficulties managing ADLs particularly due to motor functioning and is overall weak and tends to be repetitive in thoughts and statements.  Essentially symptoms began to present roughly 4 years ago with the patient having MRI, PET scan and previous neuropsychological testing consistent with left brain atrophy, which is also consistent with right arm motor functioning and expressive language changes.  These changes are described as a gradual decline in capabilities that has been rather steady in change.  One of the biggest changes has been changes in his expressive language with significant difficulties with word finding.  Consistencies of difficulties and day-to-day functioning are typical although when he has a change of scenery and is away from his home he may have significant worsening of symptoms.   05/19/2023 MBSS: Clinical Impression: Clinical Impression: [name] presents with an oropharyngeal dysphagia marked by significant pharyngeal residue and generally silent, trace aspiration of most consistencies.  Notable is weak contracture of pharyngeal musculature, particularly poor pharyngeal stripping and reduced base of tongue propulsion, leading to accumulation of residue particularly in the valleculae and to lesser degree in pyriforms.  Thin viscosities passed more readily through the pharynx and UES, but were more likely to be aspirated. Thicker viscosities were more likely to sit in pharynx.   There was occasional escape of thinner viscosities into the nasopharynx. The epiglottis inverted over larynx intermittently; more often there was NOT full inversion, with tip folded up against pharyngeal walls. Cued cough was weak.  Pt had difficulty  executing chin tucks and head turns.  After the study, Mr. and Mrs. viewed the imaging and we discussed concerns for his progressing dysphagia.   COGNITION: Overall cognitive status: Impaired Areas of impairment:  Attention: Impaired: Sustained, Alternating, Divided Memory: Impaired: Long term Procedural Awareness: Impaired: Neglect Executive function: Impaired: Error awareness Functional deficits: cognitive deficits affecting IDLs-wife. Patient demonstrates awareness of cognitive loss, but will perseverate on reasons as to why he thinks this has occurred.    AUDITORY COMPREHENSION: Overall auditory comprehension: Impaired: simple, moderately complex, and complex YES/NO questions: Appears intact Following directions: Impaired: simple, moderately complex, and complex Conversation: Simple, Complex, and Moderately Complex Interfering components: attention, awareness, hearing, and processing speed Effective technique: pausing, repetition/stressing words, slowed speech, written cues, and stressing words  EXPRESSION: verbal  VERBAL EXPRESSION: Level of generative/spontaneous verbalization: word and phrase Repetition: Appears intact Pragmatics: Appears intact Interfering components:  Progressive Lewy body  Effective technique: semantic cues, sentence completion, phonemic cues, written cues, and melodic intonation Non-verbal means of communication: N/A  PERCEPTUAL VOICE ASSESSMENT: Voice quality: hoarse and low vocal intensity Resonance: normal Respiratory function: diaphragmatic/abdominal breathing   STANDARDIZED ASSESSMENTS: Standardized assessments not given at this time due to extensive cognitive testing completed 04/22/23  PATIENT REPORTED OUTCOME MEASURES (PROM): Cognitive Function: Rated as "cannot do" in almost all areas including: keeping track  of time, checking for financial documents, planning/managing household chores.    TODAY'S TREATMENT:                                                                                                                                          DATE:   05/22/24: Patient completed swallow study and knows to switch to pureed foods with more liquid between meals. Education given on focusing on calorie dense foods. Education on managing risk factors for PNA: oral care, remain active, manage health conditions  Spouse had a question about feeding tube, and was given information on pros and cons on a feeding tube and to consult with their doctor. SLP led through effortful swallow using 5 mL thin liquid. Patient was able to complete 7 effortful swallows with models, and cues. Effortful swallow demonstrated to be difficult for patient, and he required SLP help with feeding himself.SLP conducted assessment of MEP for EMST. Pt's maximum expiratory pressure today was 22 cm H2O. EMST lite device was started at 75% MEP at 16 cm H2O, however pt only able to complete x2 reps. Decreased pressure to 10 cm H2O, with pt able to successfully complete 3 repetitions x2. Based on perception of effort level, this setting appears appropriate at this time. SLP provided demonstration and verbal instruction to utilize EMST device. HEP given of effortful swallows as well as EMST device.  Caregiver coaching provided on cueing strategies to aid in pt's ability to complete exercises. All questions answered to patient satisfaction.   05/18/23: Patient's wife came with personal pictures to help create personalized AAC device. SLP created family pages with names for each family member. Patient required frequent mod-A of semantic, visual, and phonemic cues to add in recall of family member names. Patient previously created emergency contact card that he carries with him on his phone. Patient able to practice with with low-tech AAC provided today which increased accuracy of word recall. Patient tolerated treatment well and thanked SLP for help. HEP of collecting more pictures with wife  will email. Provided wife with phone number to reschedule MBSS to earlier date.   05/16/23: Patient's wife returned with lists of frequently used/functional words. SLP educated on purpose of communication book and started making with information provided. Patient able to talk through favorite restaurants and what he likes in each one. All questions answered to patient/spouse satisfaction. HEP sent home with start of communication book, and to practice at home.   05/12/23: Patient spouse states concerns in swallowing and word finding. Wants to find tools to put in place before things progress. Patient presented with difficulty word finding, word recall, and repetition. HEP given to create list of personally relevant information to create communication aids. Patient failed 3 oz swallow test by taking multiple sips. MBSS ordered with education provided on the test. Patient struggled following multi-step instructions and required max-A of repetition, immediate feedback, and visual  and phonemic cues. Speak Out! Book ordered to address vocal volume and cognitive exercises.   PATIENT EDUCATION: Education details: see above.  Person educated: Patient and Spouse Education method: Explanation Education comprehension: verbalized understanding   GOALS: Goals reviewed with patient? Yes  SHORT TERM GOALS: Target date: 06/09/23  Patient will find 6 family members names with AAC/external aid and occasional mod-A.  Baseline: Goal status: IN PROGRESS  2.  Patient will use scripts for phones and functional conversation with occasional mod-A.  Baseline:  Goal status: IN PROGRESS  3.  Patient will completed MBSS.  Baseline: Fail of 3 oz swallow test. Goal status: IN PROGRESS  4.  Pt will name 8 items in personally relevant category with AAC/external aids and occasional mod A Baseline:  Goal status: IN PROGRESS   LONG TERM GOALS: Target date: 07/07/23  Patient will use multi-modal communication to take  three turns in a conversation with occasional min-A.  Baseline:  Goal status: IN PROGRESS  2.  Patient will use multi-modal communication to make choices with occasional min-A  Baseline:  Goal status: IN PROGRESS  3.  Family will implement strategies, AAC to provide extra time, verbal cues to support pt's communication and participation in conversations with min-A.  Baseline:  Goal status: IN PROGRESS  4.  Patient and family will use external memory aids to complete ADLs with min-A.  Baseline:  Goal status: IN PROGRESS   ASSESSMENT:  CLINICAL IMPRESSION: Patient is a 77 y.o. M who was seen today for a cognition and language assessment. Neuropsych evaluation completed on 04/12/23 states patient is now displaying significant deficits for lexical and semantic verbal fluency, significant changes in executive functioning, significant decrease in information processing speed and significant and profound deficits with regard to his capacity to learn new visual and auditory information. struggles with verbal comprehension, perceptual reasoning, working memory and processing speed (all rated extremely low). Based on PROM, patient struggles with management and planning on household responsibilities, remembering names of familiar names of family members and friend, and following simple and complex instructions. ST is recommended at this time to address cognitive decline and to put tools in place before his progression worsens, and to support family and caregivers.    OBJECTIVE IMPAIRMENTS: include attention, memory, awareness, executive functioning, expressive language, receptive language, and dysphagia. These impairments are limiting patient from ADLs/IADLs, effectively communicating at home and in community, and safety when swallowing. Factors affecting potential to achieve goals and functional outcome are severity of impairments. Patient will benefit from skilled SLP services to address above  impairments and improve overall function.  REHAB POTENTIAL: Poor due to progressive disease.   PLAN:  SLP FREQUENCY: 2x/week  SLP DURATION: 8 weeks  PLANNED INTERVENTIONS: Aspiration precaution training, Environmental controls, Cognitive reorganization, Internal/external aids, Multimodal communication approach, SLP instruction and feedback, and Compensatory strategies    Maia Breslow, CCC-SLP 05/23/2023, 1:15 PM

## 2023-05-23 NOTE — Patient Instructions (Addendum)
SWALLOWING EXERCISES Effortful Swallows - Squeeze hard with the muscles in your neck while you swallow your  saliva or a sip of water - Repeat 10 times, 1-2 times a day -Use a spoon with water.        2. EMST  -10 times twice a day.

## 2023-05-24 ENCOUNTER — Telehealth: Payer: Self-pay | Admitting: Neurology

## 2023-05-24 NOTE — Telephone Encounter (Signed)
-----   Message from Melvyn Novas, MD sent at 05/20/2023  2:02 PM EDT ----- If the patient and wife had time to think about the recommendations by speech therapy - these are their options, and all have significant  impact on quality of life and end of life decisions. Feeding tube, Palliative care consult ( normally comes from PCP, but if he is not following  this likely needs to be offered from [place]) or trying to get by with thickened food and adjusted motor activity.  I have already written for outpatient swallowing therapy should this be the decision.

## 2023-05-24 NOTE — Telephone Encounter (Signed)
Called and spoke with the wife in regard to the swallowing test results. They are working with ST and continuing the exercises. They are interested in the palliative c/s and was going to discuss if Dr Vickey Huger would be willing to place this. They don't feel like feeding tube would be necessary at this time. She was appreciative for the call and will be here tomorrow for pt's follow up

## 2023-05-25 ENCOUNTER — Telehealth: Payer: Self-pay | Admitting: Neurology

## 2023-05-25 ENCOUNTER — Ambulatory Visit: Payer: PPO | Admitting: Neurology

## 2023-05-25 ENCOUNTER — Encounter: Payer: Self-pay | Admitting: Neurology

## 2023-05-25 ENCOUNTER — Ambulatory Visit: Payer: PPO | Admitting: Speech Pathology

## 2023-05-25 VITALS — BP 112/60 | HR 60 | Ht 68.0 in | Wt 114.0 lb

## 2023-05-25 DIAGNOSIS — R482 Apraxia: Secondary | ICD-10-CM

## 2023-05-25 DIAGNOSIS — G231 Progressive supranuclear ophthalmoplegia [Steele-Richardson-Olszewski]: Secondary | ICD-10-CM

## 2023-05-25 DIAGNOSIS — R292 Abnormal reflex: Secondary | ICD-10-CM

## 2023-05-25 DIAGNOSIS — R4701 Aphasia: Secondary | ICD-10-CM

## 2023-05-25 DIAGNOSIS — M542 Cervicalgia: Secondary | ICD-10-CM | POA: Diagnosis not present

## 2023-05-25 DIAGNOSIS — G309 Alzheimer's disease, unspecified: Secondary | ICD-10-CM

## 2023-05-25 DIAGNOSIS — M79669 Pain in unspecified lower leg: Secondary | ICD-10-CM | POA: Diagnosis not present

## 2023-05-25 DIAGNOSIS — R1312 Dysphagia, oropharyngeal phase: Secondary | ICD-10-CM | POA: Diagnosis not present

## 2023-05-25 DIAGNOSIS — G3109 Other frontotemporal dementia: Secondary | ICD-10-CM

## 2023-05-25 NOTE — Telephone Encounter (Addendum)
Referral faxed to [facility] : Fax [phone]   Phone: [phone]

## 2023-05-25 NOTE — Therapy (Signed)
OUTPATIENT SPEECH LANGUAGE PATHOLOGY TREATMENT   Patient Name: Alexander Cummings MRN: [id number] DOB:[birth year], 77 y.o., male Today's Date: 05/25/2023  PCP: Johny Blamer, MD REFERRING PROVIDER: Dohmeier, Porfirio Mylar, MD  END OF SESSION:  End of Session - 05/25/23 1444     Visit Number 5    Number of Visits 17    Date for SLP Re-Evaluation 07/07/23    SLP Start Time 0245    SLP Stop Time  0330    SLP Time Calculation (min) 45 min    Activity Tolerance Patient tolerated treatment well                 Past Medical History:  Diagnosis Date   Adhesive capsulitis of right shoulder 06/19/2019   BCC (basal cell carcinoma)    face   Carpal tunnel syndrome    right wrist   Kidney stones    Osteoarthritis    back, knees, neck   Recurrent upper respiratory infection (URI)    gets colds infrequently...   Tinea versicolor    Past Surgical History:  Procedure Laterality Date   COLONOSCOPY     LUMBAR LAMINECTOMY/DECOMPRESSION MICRODISCECTOMY  10/03/2011   Procedure: LUMBAR LAMINECTOMY/DECOMPRESSION MICRODISCECTOMY;  Surgeon: Hewitt Shorts;  Location: [facility];  Service: Neurosurgery;  Laterality: Left;  LEFT Lumbar four-five EXTRAFORAMINAL MICRODISKECTOMY   MOHS SURGERY  01/2014   right ctr  01/23/2019   TONSILLECTOMY     VASECTOMY     Patient Active Problem List   Diagnosis Date Noted   Dystonia of right hand 07/11/2022   Dementia (HCC) 07/11/2022   Aphasia determined by examination 12/10/2019   Dementia with behavioral disturbance (HCC) 12/10/2019   MRI of brain abnormal 10/29/2019   Generalized hyperreflexia 10/29/2019   Clumsiness on examination 10/29/2019   Apraxia 10/29/2019   Aphasia 10/29/2019   MCI (mild cognitive impairment) with memory loss 10/29/2019    ONSET DATE: 05/10/23   REFERRING DIAG:  R47.01 (ICD-10-CM) - Aphasia determined by examination  G30.9 (ICD-10-CM) - Alzheimer's disease, unspecified (CODE) (HCC)  R90.89 (ICD-10-CM) - MRI of  brain abnormal  G31.09,F02.B18 (ICD-10-CM) - 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 (HCC)    THERAPY DIAG:  Dysphagia, oropharyngeal phase  Rationale for Evaluation and Treatment: Habilitation  SUBJECTIVE:   SUBJECTIVE STATEMENT: "Busy day, with PT and neurology appointment." Spouse reporting increased challenges in following directions during PT.  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 No  FALLS: Has patient fallen in last 6 months?  No  LIVING ENVIRONMENT: Lives with: lives with their family and lives with their spouse Lives in: House/apartment  PLOF:  Level of assistance: Needed assistance with ADLs, Needed assistance with IADLS Employment: Retired  PATIENT GOALS: Spouse has concerns in swallowing progression, word finding.   OBJECTIVE:   DIAGNOSTIC FINDINGS:   04/12/23 Neuropsych progress note:  Patient and wife  report that 4 years ago they began noting more changes in his overall gait and the fact that the patient is now using his left hand almost exclusively.  Tremors were noted to have begun roughly 4 to 5 years ago even when at the time he was rather healthy overall.  The patient denies any significant pain.  The patient has lost roughly 40 pounds of weight over the past several years without purposeful attempts at  weight loss.  He is now quite thin and frail.  The patient is having increasing difficulties with comprehension and understanding, executive functioning, visual-spatial awareness, word finding and word recall, expressive language changes, short-term memory difficulties and periods of confusion.  He is having increased difficulties managing ADLs particularly due to motor functioning and is overall weak and tends to be repetitive in thoughts and statements.  Essentially symptoms began to present roughly 4 years ago with the patient having MRI, PET scan and previous neuropsychological testing consistent with left brain atrophy, which is also consistent with right arm motor functioning and expressive language changes.  These changes are described as a gradual decline in capabilities that has been rather steady in change.  One of the biggest changes has been changes in his expressive language with significant difficulties with word finding.  Consistencies of difficulties and day-to-day functioning are typical although when he has a change of scenery and is away from his home he may have significant worsening of symptoms.   05/19/2023 MBSS: Clinical Impression: Clinical Impression: Mr. Munford presents with an oropharyngeal dysphagia marked by significant pharyngeal residue and generally silent, trace aspiration of most consistencies.  Notable is weak contracture of pharyngeal musculature, particularly poor pharyngeal stripping and reduced base of tongue propulsion, leading to accumulation of residue particularly in the valleculae and to lesser degree in pyriforms.  Thin viscosities passed more readily through the pharynx and UES, but were more likely to be aspirated. Thicker viscosities were more likely to sit in pharynx.   There was occasional escape of thinner viscosities into the nasopharynx. The epiglottis inverted over larynx intermittently; more often there was NOT full inversion, with tip folded up against  pharyngeal walls. Cued cough was weak.  Pt had difficulty executing chin tucks and head turns.  After the study, Mr. and Mrs. viewed the imaging and we discussed concerns for his progressing dysphagia.   COGNITION: Overall cognitive status: Impaired Areas of impairment:  Attention: Impaired: Sustained, Alternating, Divided Memory: Impaired: Long term Procedural Awareness: Impaired: Neglect Executive function: Impaired: Error awareness Functional deficits: cognitive deficits affecting IDLs-wife. Patient demonstrates awareness of cognitive loss, but will perseverate on reasons as to why he thinks this has occurred.    AUDITORY COMPREHENSION: Overall auditory comprehension: Impaired: simple, moderately complex, and complex YES/NO questions: Appears intact Following directions: Impaired: simple, moderately complex, and complex Conversation: Simple, Complex, and Moderately Complex Interfering components: attention, awareness, hearing, and processing speed Effective technique: pausing, repetition/stressing words, slowed speech, written cues, and stressing words  EXPRESSION: verbal  VERBAL EXPRESSION: Level of generative/spontaneous verbalization: word and phrase Repetition: Appears intact Pragmatics: Appears intact Interfering components:  Progressive Lewy body  Effective technique: semantic cues, sentence completion, phonemic cues, written cues, and melodic intonation Non-verbal means of communication: N/A  PERCEPTUAL VOICE ASSESSMENT: Voice quality: hoarse and low vocal intensity Resonance: normal Respiratory function: diaphragmatic/abdominal breathing   STANDARDIZED ASSESSMENTS: Standardized assessments not given at this time due to extensive cognitive testing completed 04/22/23  PATIENT REPORTED OUTCOME MEASURES (PROM): Cognitive Function: Rated as "cannot do"  in almost all areas including: keeping track of time, checking for financial documents, planning/managing household  chores.    TODAY'S TREATMENT:                                                                                                                                         DATE:   05/25/23: SWALLOW: Reviewed swallow compensations and HEP. Patient and spouse able to demonstrate 3 trials with EMST device with min-A verbal cues to A in improved cues on behalf of caregiver.  LANGUAGE: SLP led through low tech AAC and asked patient additional questions for elaborations. Pt able to participate in personally relevant conversation with 75% accuracy with use of low tech communication aid. Occasional cues provided to orient to aid. HEP sent home of using AAC with other family members as well as creating list of functional needs or breakdowns at home, to problem solve in future ST sessions. Future ST therapy will focus on functional routines, as well as word finding strategies. All questions answered to spouse satisfaction.   05/22/24: Patient completed swallow study and knows to switch to pureed foods with more liquid between meals. Education given on focusing on calorie dense foods. Education on managing risk factors for PNA: oral care, remain active, manage health conditions  Spouse had a question about feeding tube, and was given information on pros and cons on a feeding tube and to consult with their doctor. SLP led through effortful swallow using 5 mL thin liquid. Patient was able to complete 7 effortful swallows with models, and cues. Effortful swallow demonstrated to be difficult for patient, and he required SLP help with feeding himself.SLP conducted assessment of MEP for EMST. Pt's maximum expiratory pressure today was 22 cm H2O. EMST lite device was started at 75% MEP at 16 cm H2O, however pt only able to complete x2 reps. Decreased pressure to 10 cm H2O, with pt able to successfully complete 3 repetitions x2. Based on perception of effort level, this setting appears appropriate at this time. SLP provided  demonstration and verbal instruction to utilize EMST device. HEP given of effortful swallows as well as EMST device.  Caregiver coaching provided on cueing strategies to aid in pt's ability to complete exercises. All questions answered to patient satisfaction.   05/18/23: Patient's wife came with personal pictures to help create personalized AAC device. SLP created family pages with names for each family member. Patient required frequent mod-A of semantic, visual, and phonemic cues to add in recall of family member names. Patient previously created emergency contact card that he carries with him on his phone. Patient able to practice with with low-tech AAC provided today which increased accuracy of word recall. Patient tolerated treatment well and thanked SLP for help. HEP of collecting more pictures with wife will email. Provided wife with phone number to reschedule MBSS to earlier date.   05/16/23: Patient's wife returned with  lists of frequently used/functional words. SLP educated on purpose of communication book and started making with information provided. Patient able to talk through favorite restaurants and what he likes in each one. All questions answered to patient/spouse satisfaction. HEP sent home with start of communication book, and to practice at home.   05/12/23: Patient spouse states concerns in swallowing and word finding. Wants to find tools to put in place before things progress. Patient presented with difficulty word finding, word recall, and repetition. HEP given to create list of personally relevant information to create communication aids. Patient failed 3 oz swallow test by taking multiple sips. MBSS ordered with education provided on the test. Patient struggled following multi-step instructions and required max-A of repetition, immediate feedback, and visual and phonemic cues. Speak Out! Book ordered to address vocal volume and cognitive exercises.   PATIENT EDUCATION: Education  details: see above.  Person educated: Patient and Spouse Education method: Explanation Education comprehension: verbalized understanding  GOALS: Goals reviewed with patient? Yes  SHORT TERM GOALS: Target date: 06/09/23  Patient will find 6 family members names with AAC/external aid and occasional mod-A.  Baseline: Goal status: IN PROGRESS  2.  Patient will use scripts for phones and functional conversation with occasional mod-A.  Baseline:  Goal status: IN PROGRESS  3.  Patient will completed MBSS.  Baseline: Fail of 3 oz swallow test. Goal status: MET  4.  Pt will name 8 items in personally relevant category with AAC/external aids and occasional mod A Baseline:  Goal status: IN PROGRESS   LONG TERM GOALS: Target date: 07/07/23  Patient will use multi-modal communication to take three turns in a conversation with occasional min-A.  Baseline:  Goal status: IN PROGRESS  2.  Patient will use multi-modal communication to make choices with occasional min-A  Baseline:  Goal status: IN PROGRESS  3.  Family will implement strategies, AAC to provide extra time, verbal cues to support pt's communication and participation in conversations with min-A.  Baseline:  Goal status: IN PROGRESS  4.  Patient and family will use external memory aids to complete ADLs with min-A.  Baseline:  Goal status: IN PROGRESS   ASSESSMENT:  CLINICAL IMPRESSION: Patient is a 77 y.o. M who was seen today for a cognition and language assessment. Neuropsych evaluation completed on 04/12/23 states patient is now displaying significant deficits for lexical and semantic verbal fluency, significant changes in executive functioning, significant decrease in information processing speed and significant and profound deficits with regard to his capacity to learn new visual and auditory information. struggles with verbal comprehension, perceptual reasoning, working memory and processing speed (all rated extremely  low). Based on PROM, patient struggles with management and planning on household responsibilities, remembering names of familiar names of family members and friend, and following simple and complex instructions. ST is recommended at this time to address cognitive decline and to put tools in place before his progression worsens, and to support family and caregivers.    OBJECTIVE IMPAIRMENTS: include attention, memory, awareness, executive functioning, expressive language, receptive language, and dysphagia. These impairments are limiting patient from ADLs/IADLs, effectively communicating at home and in community, and safety when swallowing. Factors affecting potential to achieve goals and functional outcome are severity of impairments. Patient will benefit from skilled SLP services to address above impairments and improve overall function.  REHAB POTENTIAL: Poor due to progressive disease.   PLAN:  SLP FREQUENCY: 2x/week  SLP DURATION: 8 weeks  PLANNED INTERVENTIONS: Aspiration precaution training, Environmental controls, Cognitive  reorganization, Internal/external aids, Multimodal communication approach, SLP instruction and feedback, and Compensatory strategies    [facility], Student-SLP 05/25/2023, 2:44 PM

## 2023-05-25 NOTE — Progress Notes (Signed)
Provider:  Melvyn Novas, MD  Primary Care Physician:  Alexander Retort, MD [phone] [address] [place] [address]     Referring Provider: Noberto Retort, Md [address] [place],  [place] [address]          Chief Complaint according to patient   Patient presents with:     New Patient (Initial Visit)      Rm 1, here with wife Alexander Cummings Follow up after neuropsychological testing and barium swallow study.        HISTORY OF PRESENT ILLNESS:  Alexander Cummings is a 77 y.o. male patient who is here for revisit 05/25/2023 for  Lewy Body dementia/ Parkinson' Plus. PSP, now becoming rigid.    Progression inrigidity , dementia,  aphasia, apraxia.  All progressing . Patient has a living will. Chief concern according to patient : Barium Test documented insufficient  swallowing  apraxia-  high aspiration risk, d following on swallowing evaluation with ST. Referred to ST for further  therapy but likely this is a temporary respite. He has lost more weight ,  BMI 17 underweight. He recently fell out of bed. His bed is an upstairs bedroom, he has lost use of his right hand.     Alexander Cummings is a 77 y.o.  Caucasian male patient seen on 07-11-2022, with wife. Reports no new concerns. Wife reports he only takes namenda once daily. (We decided to go to a 24 XR form of the medication after this visit concluded. ) He is visibly losing muscle mass, lost weight - he looks frail.  his memory test reveals tremor in the drawing parts and a lower score overall .  His right arm is flexed, the hand is cooler, his pulse is slow and regular. Capillary refill is slower in the fingers of the right, dominant hand, no swelling, no discoloration, no rash-   , His memory is further declined.  orthopedist, shoulder- C spine evaluation by eval, NS : Alexander Cummings.    I am more concerned about a dementia with frontal lobe involvement. Needing  a more recent NCV/ EMG for the upper  extremities.  Vision - he looks for objects,  can't see some objects in front of him. Wife reports visio spatial difficulties , she wonders if he has a central scotoma.  Supposingly "Normal eye exam"- but needs visual field test.  Hearing impaired - has trouble to place the hearing aids by himself.             12-08-2021: Alzheimer patient here  for follow up with spouse. Overall about the same. Wife states he still driving. They handle financial affairs together, tax returns.  Gabapentin 300 mg daily prescribed by another provider. Remeron 7.5 mg at night for sleep prescribed by Alexander Cummings for appetite. Memory support by Namenda and Aricept.  His visits with neurosurgeon ended in dx of Cervical radiculopathy causing pain and Weakness of right hand, he has had PT. Reports this is not getting Cummings. Had carpal tunnel surgery  in 12-2018, and NCV in January 2021: "This is an abnormal study.  There is electrodiagnostic evidence of mild slowing across the right wrist, consistent with diagnosis of mild right carpal tunnel syndrome.  There is no evidence of right cervical radiculopathy".  [facility] repeated NCV , 03-2020: Conclusion:    This study is abnormal.  There is electrodiagnostic without sonographic evidence for mild right median  mononeuropathy at the wrist.  There is also electrodiagnostic evidence for mild chronic C6/C8 radiculopathy without evidence for ongoing denervation.    Now here for Alzheimer Dementia follow up, memory testing q 6 month. Namenda will be 10 mg bid. There is dementia with behavioural changes.  The patient had an episode when he misplaced a gun while home repairs were made, and then forgot and thought the gun was stolen. He had placed in the attic, and was repeatedly denying he placed it there, He remembered 5  Days later.              05-18-2020.  patient here to talk about the  neuropsychological testing battery results, indicating Dx of   Alzheimer dementia. I see  quite substantial cortical atrophy and plump gyri.   I like to increase the Alzheimer's medication- to 10 mg, and have him take a multivitamin and have OT.            04-20-2020, after neuropsychological testing . They had the neuro psych completed with Alexander. Carmina Cummings, but have not heard about results yet.  We did not get results of these testing batteries. Carpal tunnel ? He has seen a Alexander. Gilberto Cummings , MD, PhD,  orthopedist at [facility] - who wanted yet another NCV and EMG for this patient.  Alexander. Missy Cummings had referred him.         12/10/2019 in a RV.  Wife is here in person, daughter is on the phone. The patient is defensive.  His MOCA was 23/ 30 , his handwriting is changed, his word output is reduced.  His MRI brain showed left sided atrophy, which involves the word-finding part. He replaces some words with similar words, such as body temperature with pulse,    We are now repeating an NCV and EMG to help differentiate his dominant hand/ arm dysfunction from neck injury/ DDD or from CNS changes. Also referral to neuropsychology testing . Mental status / memory- short term memory loss. He had failed 4-5 years ago a memory test for long term care insurance.  He has progressed over the last 6 month , according to his RN daughter, Alexander Cummings.         10-29-2019 He was seen here as an URGENT  referral on  from Alexander Orlan Leavens* for a neck-spine and abnormal brain MRI evaluation, while the family is concerned about a memory loss.  Chief concern according to patient : abnormal MRI of cervical spine is not families main concern.    I have the pleasure of seeing Alexander Cummings today, a right -handed White or Caucasian male with a Master's degree in accounting- MBA.  He  has a past medical history of Adhesive capsulitis of right shoulder (06/19/2019), BCC (basal cell carcinoma), Carpal tunnel syndrome, Kidney stones, Osteoarthritis, Recurrent upper respiratory infection (URI), and Tinea  versicolor.    Family noted symptoms about a year ago, progressing over the last 6 month Alexander Cummings- RN and daughter). Wife noted decrease in weight over 24 month, losing 20 pounds. Noted word finding problems, stumbling and stiffness, clumsiness, and cognitive impairment. "playing charades' at home trying to finish his sentences. Apraxia with common tasks  like how to use a credit card reader.   Changing of lanes when driving- wife bought a lane alarm.  Forgets turn signal.  Anosmia and ageusia after a fall from a ladder- 2017 MRI is in EPIC- no abnormalities.  New MRI images are not available-  speaks of vast changes. .    Family medical  history: Mother died at age 37, late onset dementia. Father died at 78 Nov.1st 2018-07-04.     Social history: Patient  retired from [employer] and lives in a household with 2 persons. Family status is married , with adult children, grandchildren. One dog.    Tobacco use; never.  ETOH use : one beer a week. Caffeine intake in form of Coffee( 2 cups ) Soda( rare) Tea ( rare ) or energy drinks. Regular exercise in form of tennis, golf.  Walking the dog.  Hobbies : tennis.         Review of Systems: Out of a complete 14 system review, the patient complains of only the following symptoms, and all other reviewed systems are negative.:  Social History   Socioeconomic History   Marital status: Married    Spouse name: Not on file   Number of children: Not on file   Years of education: Not on file   Highest education level: Not on file  Occupational History   Not on file  Tobacco Use   Smoking status: Never   Smokeless tobacco: Never  Substance and Sexual Activity   Alcohol use: Yes    Alcohol/week: 1.0 standard drink of alcohol    Types: 1 Cans of beer per week   Drug use: No   Sexual activity: Yes  Other Topics Concern   Not on file  Social History Narrative   Not on file   Social Determinants of Health   Financial Resource Strain: Not on  file  Food Insecurity: Not on file  Transportation Needs: Not on file  Physical Activity: Not on file  Stress: Not on file  Social Connections: Not on file    Family History  Problem Relation Age of Onset   Pancreatic cancer Brother 64       dx'd 5 months before death   Dementia Mother     Past Medical History:  Diagnosis Date   Adhesive capsulitis of right shoulder 06/19/2019   BCC (basal cell carcinoma)    face   Carpal tunnel syndrome    right wrist   Kidney stones    Osteoarthritis    back, knees, neck   Recurrent upper respiratory infection (URI)    gets colds infrequently...   Tinea versicolor     Past Surgical History:  Procedure Laterality Date   COLONOSCOPY     LUMBAR LAMINECTOMY/DECOMPRESSION MICRODISCECTOMY  10/03/2011   Procedure: LUMBAR LAMINECTOMY/DECOMPRESSION MICRODISCECTOMY;  Surgeon: Hewitt Shorts;  Location: [facility];  Service: Neurosurgery;  Laterality: Left;  LEFT Lumbar four-five EXTRAFORAMINAL MICRODISKECTOMY   MOHS SURGERY  01/2014   right ctr  01/23/2019   TONSILLECTOMY     VASECTOMY       Current Outpatient Medications on File Prior to Visit  Medication Sig Dispense Refill   donepezil (ARICEPT) 10 MG tablet TAKE 1 TABLET BY MOUTH AT BEDTIME 90 tablet 0   gabapentin (NEURONTIN) 300 MG capsule Take 300 mg by mouth daily.     memantine (NAMENDA) 10 MG tablet Take 1 tablet by mouth twice daily 180 tablet 0   mirtazapine (REMERON) 7.5 MG tablet Take 7.5 mg by mouth at bedtime.     memantine (NAMENDA XR) 21 MG CP24 24 hr capsule Take 1 capsule (21 mg total) by mouth daily. 30 capsule 5   No current facility-administered medications on file prior to visit.    No Known Allergies  DIAGNOSTIC DATA (LABS, IMAGING, TESTING) - I reviewed patient records, labs, notes, testing and imaging myself where available.  Lab Results  Component Value Date   WBC 5.5 09/20/2018   HGB 14.5 09/20/2018   HCT 45.3 09/20/2018   MCV 94.6 09/20/2018    PLT 209 09/20/2018      Component Value Date/Time   NA 142 07/11/2022 1009   K 4.2 07/11/2022 1009   CL 103 07/11/2022 1009   CO2 27 07/11/2022 1009   GLUCOSE 110 (H) 07/11/2022 1009   GLUCOSE 101 (H) 09/20/2018 1205   BUN 17 07/11/2022 1009   CREATININE 0.74 (L) 07/11/2022 1009   CALCIUM 10.2 07/11/2022 1009   PROT 6.2 07/11/2022 1009   ALBUMIN 4.3 07/11/2022 1009   AST 16 07/11/2022 1009   ALT 11 07/11/2022 1009   ALKPHOS 77 07/11/2022 1009   BILITOT 0.9 07/11/2022 1009   GFRNONAA >60 09/20/2018 1205   GFRAA >60 09/20/2018 1205   No results found for: "CHOL", "HDL", "LDLCALC", "LDLDIRECT", "TRIG", "CHOLHDL" No results found for: "HGBA1C" No results found for: "VITAMINB12" No results found for: "TSH"  PHYSICAL EXAM:  Today's Vitals   05/25/23 1311  BP: 112/60  Pulse: 60  Weight: 114 lb (51.7 kg)  Height: 5\' 8"  (1.727 m)   Body mass index is 17.33 kg/m.   Wt Readings from Last 3 Encounters:  05/25/23 114 lb (51.7 kg)  08/31/22 115 lb 12.8 oz (52.5 kg)  07/11/22 118 lb (53.5 kg)     Ht Readings from Last 3 Encounters:  05/25/23 5\' 8"  (1.727 m)  08/31/22 5\' 8"  (1.727 m)  07/11/22 5\' 8"  (1.727 m)      General:  The patient is awake, alert and appears not in acute distress. The patient is well groomed. He is tanned.  Head: Normocephalic, atraumatic. Neck is supple. Mallampati 1- wide open with retrognathia.,  neck circumference 15.5  inches . Nasal airflow patent.  Dental status: intact.    Cardiovascular:  Regular rate and cardiac rhythm by pulse,  without distended neck veins. Respiratory: Lungs are clear to auscultation.  Skin:  Without evidence of ankle edema, or rash. Trunk: The patient's posture is erect.    Neurologic exam : The patient is awake and alert, oriented to place and time.   Memory subjective described -  defensiveness-  Attention span & concentration ability appears abnormal, he is angry - about the test, about the test results not being  here. Speech is fluent,  without  dysarthria, dysphonia or aphasia.  Mood and affect are slightly agitated.        07/11/2022    8:59 AM 12/08/2021    9:46 AM 06/09/2021   10:29 AM 12/08/2020   10:27 AM 04/20/2020    1:55 PM  Montreal Cognitive Assessment   Visuospatial/ Executive (0/5) 2 3 5 4 3   Naming (0/3) 3 3 2 3 2   Attention: Read list of digits (0/2) 0 0 1 2 1   Attention: Read list of letters (0/1) 0 1 1 1 1   Attention: Serial 7 subtraction starting at 100 (0/3) 2 3 1 1 3   Language: Repeat phrase (0/2) 0 0 1 1 0  Language : Fluency (0/1) 0 0 0 1 0  Abstraction (0/2) 2 2 2 1 2   Delayed Recall (0/5) 1 3 0 0 2  Orientation (0/6) 5 6 5 6 6   Total 15 21 18 20 20         10/29/2019    9:25 AM  MMSE - Mini Mental State Exam  Orientation to time 5  Orientation to Place 5  Registration 3  Attention/ Calculation 4  Recall 3  Language- name 2 objects 2  Language- repeat 1  Language- follow 3 step command 3  Language- read & follow direction 1  Write a sentence 1  Copy design 0  Total score 28        Cranial nerves: no loss of smell or taste reported  Pupils are equal and briskly reactive to light. Funduscopic exam intact.  Extraocular movements in vertical plane restricted - PSNP ( PSP ) -  No Diplopia.  Visual fields by finger perimetry are intact. Hearing was intact to soft voice and finger rubbing.    Facial sensation intact to fine touch.  Facial motor strength is symmetric - facial expression is " astonished " and tongue and uvula move midline.  Neck ROM :restriction, stooped- for age . The right shoulder shrug was symmetrical, but the right shoulder droops.     Motor exam:  loss of bulk, increased tone in right arm, flexed at elbow.  He has problems with dexterity, handwriting , drawing.  Waxy increased tone with cog wheeling, there is now rigor !!  asymmetric grip strength- reduced in both hands.  Sensory:  No longer responding    Coordination: Rapid alternating  movements in the fingers/hands were : no longer able to do.  The Finger-to-nose maneuver was not performed on the right and slowed on the left.    Gait and station: Patient could  not rise unassisted from a seated position, he is stooped and rigid.  Stance is of normal width/ base and the patient turned with 4 steps.  Toe and heel walk were intact. Deep tendon reflexes: in the upper and lower extremities are symmetric and very brisk- crossed reflex reaction .Babinski response was deferred.        ASSESSMENT AND PLAN 77 y.o. year old male  here with:    1) advancing Parkinson Plus disorder with PSP, having symptoms of Lewy body dementia. Rigidity is much worse, left arm and hand  now affected too.   2) apraxia, aphasia, anosmia, ageusia. Now swallowing impaired-  feeding tube was discussed and declined.  Referral to palliative care    3) Needs to be referred for palliative care/ for in home safety. There is no rehab potential, see neuropsychological battery and results. I am more concerned about this being a PARKINSON PLUS disorder, rather than a Alzheimer's dementia.    I plan to follow up either personally or through our NP within 3-6 months.   I would like to thank Alexander Retort, MD  for allowing me to meet with and to take care of this pleasant patient.   CC: I will share my notes with PCP.  After spending a total time of  30  minutes face to face and additional time for physical and neurologic examination, review of laboratory studies,  personal review of imaging studies, reports and results of other testing and review of referral information / records as far as provided in visit,   Electronically signed by: Alexander Novas, MD 05/25/2023 1:49 PM  [facility] Board certified by The [employer] of Sleep Medicine and Diplomate of the [employer] of [employer] Medicine. Board certified In Neurology through the ABPN, Fellow of the  [employer] of [employer].

## 2023-05-30 DIAGNOSIS — M79669 Pain in unspecified lower leg: Secondary | ICD-10-CM | POA: Diagnosis not present

## 2023-05-30 DIAGNOSIS — M542 Cervicalgia: Secondary | ICD-10-CM | POA: Diagnosis not present

## 2023-06-05 NOTE — Telephone Encounter (Signed)
Received a staff message below.  "Thank you for entrusting the care of your patient to the [facility] team. We contacted Alexander Cummings's family and they requested [facility] to do the palliative/hospice evaluation on Wed 07/14 at 14:00. They were going to the beach on vacation and wanted to have a nice time with Alexander Cummings, upon their return they will be available for [place]. If you have any questions feel free to call me."

## 2023-06-12 DIAGNOSIS — H903 Sensorineural hearing loss, bilateral: Secondary | ICD-10-CM | POA: Diagnosis not present

## 2023-06-13 ENCOUNTER — Ambulatory Visit: Payer: PPO | Attending: Neurology | Admitting: Speech Pathology

## 2023-06-13 DIAGNOSIS — R41841 Cognitive communication deficit: Secondary | ICD-10-CM | POA: Insufficient documentation

## 2023-06-13 DIAGNOSIS — R1312 Dysphagia, oropharyngeal phase: Secondary | ICD-10-CM | POA: Diagnosis not present

## 2023-06-13 DIAGNOSIS — R1310 Dysphagia, unspecified: Secondary | ICD-10-CM | POA: Insufficient documentation

## 2023-06-13 DIAGNOSIS — M542 Cervicalgia: Secondary | ICD-10-CM | POA: Diagnosis not present

## 2023-06-13 DIAGNOSIS — R4701 Aphasia: Secondary | ICD-10-CM | POA: Insufficient documentation

## 2023-06-13 DIAGNOSIS — M79669 Pain in unspecified lower leg: Secondary | ICD-10-CM | POA: Diagnosis not present

## 2023-06-13 NOTE — Therapy (Signed)
OUTPATIENT SPEECH LANGUAGE PATHOLOGY TREATMENT   Patient Name: Alexander Cummings MRN: [id number] DOB:10/19/1946, 77 y.o., male Today's Date: 06/13/2023  PCP: Johny Blamer, MD REFERRING PROVIDER: Dohmeier, Porfirio Mylar, MD  END OF SESSION:  End of Session - 06/13/23 1319     Visit Number 6    Number of Visits 17    Date for SLP Re-Evaluation 07/07/23    SLP Start Time 1319    SLP Stop Time  1404    SLP Time Calculation (min) 45 min    Activity Tolerance Patient tolerated treatment well             Past Medical History:  Diagnosis Date   Adhesive capsulitis of right shoulder 06/19/2019   BCC (basal cell carcinoma)    face   Carpal tunnel syndrome    right wrist   Kidney stones    Osteoarthritis    back, knees, neck   Recurrent upper respiratory infection (URI)    gets colds infrequently...   Tinea versicolor    Past Surgical History:  Procedure Laterality Date   COLONOSCOPY     LUMBAR LAMINECTOMY/DECOMPRESSION MICRODISCECTOMY  10/03/2011   Procedure: LUMBAR LAMINECTOMY/DECOMPRESSION MICRODISCECTOMY;  Surgeon: Hewitt Shorts;  Location: [facility];  Service: Neurosurgery;  Laterality: Left;  LEFT Lumbar four-five EXTRAFORAMINAL MICRODISKECTOMY   MOHS SURGERY  01/2014   right ctr  01/23/2019   TONSILLECTOMY     VASECTOMY     Patient Active Problem List   Diagnosis Date Noted   Dystonia of right hand 07/11/2022   Dementia (HCC) 07/11/2022   Aphasia determined by examination 12/10/2019   Dementia with behavioral disturbance (HCC) 12/10/2019   MRI of brain abnormal 10/29/2019   Generalized hyperreflexia 10/29/2019   Clumsiness on examination 10/29/2019   Apraxia 10/29/2019   Aphasia 10/29/2019   MCI (mild cognitive impairment) with memory loss 10/29/2019    ONSET DATE: 05/10/23   REFERRING DIAG:  R47.01 (ICD-10-CM) - Aphasia determined by examination  G30.9 (ICD-10-CM) - Alzheimer's disease, unspecified (CODE) (HCC)  R90.89 (ICD-10-CM) - MRI of brain  abnormal  G31.09,F02.B18 (ICD-10-CM) - 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 (HCC)    THERAPY DIAG:  Dysphagia, unspecified type  Aphasia  Cognitive communication deficit  Dysphagia, oropharyngeal phase  Rationale for Evaluation and Treatment: Habilitation  SUBJECTIVE:   SUBJECTIVE STATEMENT: "Going to take more day trips while we can." Pt accompanied by: significant other  PERTINENT HISTORY: Patient is a retired Pharmacist, hospital, 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 No  FALLS: Has patient fallen in last 6 months?  Yes-Fell 06/10/23 down the stairs and took about 20 minutes to get back up.   LIVING ENVIRONMENT: Lives with: lives with their family and lives with their spouse Lives in: House/apartment  PLOF:  Level of assistance: Needed assistance with ADLs, Needed assistance with IADLS Employment: Retired  PATIENT GOALS: Spouse has concerns in swallowing progression, word finding.   OBJECTIVE:  DIAGNOSTIC FINDINGS:   04/12/23 Neuropsych progress note:  Patient and wife report that 4 years ago they began noting more changes in his overall gait and the fact that the patient is now using his left hand almost exclusively.  Tremors were noted to have begun roughly 4 to 5 years ago even when at the time he was rather healthy overall.  The patient denies any significant pain.  The patient has lost roughly 40 pounds  of weight over the past several years without purposeful attempts at weight loss.  He is now quite thin and frail.  The patient is having increasing difficulties with comprehension and understanding, executive functioning, visual-spatial awareness, word finding and word recall, expressive language changes, short-term memory difficulties and periods of confusion.  He is having increased difficulties managing ADLs particularly due to motor functioning and is overall weak and tends to be repetitive in thoughts and statements.  Essentially symptoms began to present roughly 4 years ago with the patient having MRI, PET scan and previous neuropsychological testing consistent with left brain atrophy, which is also consistent with right arm motor functioning and expressive language changes.  These changes are described as a gradual decline in capabilities that has been rather steady in change.  One of the biggest changes has been changes in his expressive language with significant difficulties with word finding.  Consistencies of difficulties and day-to-day functioning are typical although when he has a change of scenery and is away from his home he may have significant worsening of symptoms.   05/19/2023 MBSS: Clinical Impression: Clinical Impression: [name] presents with an oropharyngeal dysphagia marked by significant pharyngeal residue and generally silent, trace aspiration of most consistencies.  Notable is weak contracture of pharyngeal musculature, particularly poor pharyngeal stripping and reduced base of tongue propulsion, leading to accumulation of residue particularly in the valleculae and to lesser degree in pyriforms.  Thin viscosities passed more readily through the pharynx and UES, but were more likely to be aspirated. Thicker viscosities were more likely to sit in pharynx.   There was occasional escape of thinner viscosities into the nasopharynx. The epiglottis inverted over larynx intermittently; more  often there was NOT full inversion, with tip folded up against pharyngeal walls. Cued cough was weak.  Pt had difficulty executing chin tucks and head turns.  After the study, Mr. and Mrs. viewed the imaging and we discussed concerns for his progressing dysphagia.   COGNITION: Overall cognitive status: Impaired Areas of impairment:  Attention: Impaired: Sustained, Alternating, Divided Memory: Impaired: Long term Procedural Awareness: Impaired: Neglect Executive function: Impaired: Error awareness Functional deficits: cognitive deficits affecting IDLs-wife. Patient demonstrates awareness of cognitive loss, but will perseverate on reasons as to why he thinks this has occurred.    AUDITORY COMPREHENSION: Overall auditory comprehension: Impaired: simple, moderately complex, and complex YES/NO questions: Appears intact Following directions: Impaired: simple, moderately complex, and complex Conversation: Simple, Complex, and Moderately Complex Interfering components: attention, awareness, hearing, and processing speed Effective technique: pausing, repetition/stressing words, slowed speech, written cues, and stressing words  EXPRESSION: verbal  VERBAL EXPRESSION: Level of generative/spontaneous verbalization: word and phrase Repetition: Appears intact Pragmatics: Appears intact Interfering components:  Progressive Lewy body  Effective technique: semantic cues, sentence completion, phonemic cues, written cues, and melodic intonation Non-verbal means of communication: N/A  PERCEPTUAL VOICE ASSESSMENT: Voice quality: hoarse and low vocal intensity Resonance: normal Respiratory function: diaphragmatic/abdominal breathing   STANDARDIZED ASSESSMENTS: Standardized assessments not given at this time due to extensive cognitive testing completed 04/22/23  PATIENT REPORTED OUTCOME MEASURES (PROM): Cognitive Function: Rated as "cannot do" in almost all areas including: keeping track of time,  checking for financial documents, planning/managing household chores.    TODAY'S TREATMENT:                                                                                                                                         DATE:   06/13/23: Patient's wife wants to cancel appointment on Thursday and states that they are going to go on more day trips while they are able to. She sees more changes of of patient becoming more tired during the day. Palliative care tomorrow (06/13/23), and education provided on what that appointment may look like. Patient fell over the weekend and took 20 minutes to get back up. Education provided on how PT could help in the future. Patient has switched to a pureed diet and has been intentional about using hard swallows during eating sessions, which they say have helped. Refreshed information/education on EMST, and led through 5 sets of 5 breaths during the session with occasional min-A of models and immediate feedback. Led through low tech AAC to practice word finding, patient required occasional min-A. Discussion led on getting help in the home to support spouse. Simple recipes given for dysphagia 3 diet recommendation. Spouse states that she has been adding protein powder to increase calories during meals.   05/25/23: SWALLOW: Reviewed swallow compensations and HEP. Patient and spouse able to demonstrate 3 trials with EMST device with min-A verbal cues to A in improved cues on behalf of caregiver.  LANGUAGE: SLP led through low tech AAC and asked patient additional questions for elaborations. Pt able to participate in personally relevant conversation with 75% accuracy with use of low tech communication aid. Occasional cues provided to orient to aid. HEP sent home of using AAC with other family members as well as creating list of functional needs or breakdowns at home, to problem solve in future ST sessions. Future ST therapy will focus on functional routines, as well as word  finding strategies. All questions answered to spouse satisfaction.   05/22/24: Patient completed swallow study and knows to switch to pureed foods with more liquid between meals. Education given on focusing on calorie dense foods. Education on managing risk factors for PNA: oral care, remain active, manage health conditions  Spouse had a question about feeding tube, and was given information on pros and cons on a feeding tube and to consult with their doctor. SLP led through effortful swallow using 5 mL thin liquid. Patient was able to complete 7 effortful swallows with models, and cues. Effortful swallow demonstrated to be difficult for patient, and he required SLP help with feeding himself.SLP conducted assessment of MEP for EMST. Pt's maximum expiratory pressure today was 22 cm H2O. EMST lite device was started at 75% MEP at 16 cm H2O, however pt only able to  complete x2 reps. Decreased pressure to 10 cm H2O, with pt able to successfully complete 3 repetitions x2. Based on perception of effort level, this setting appears appropriate at this time. SLP provided demonstration and verbal instruction to utilize EMST device. HEP given of effortful swallows as well as EMST device.  Caregiver coaching provided on cueing strategies to aid in pt's ability to complete exercises. All questions answered to patient satisfaction.   05/18/23: Patient's wife came with personal pictures to help create personalized AAC device. SLP created family pages with names for each family member. Patient required frequent mod-A of semantic, visual, and phonemic cues to add in recall of family member names. Patient previously created emergency contact card that he carries with him on his phone. Patient able to practice with with low-tech AAC provided today which increased accuracy of word recall. Patient tolerated treatment well and thanked SLP for help. HEP of collecting more pictures with wife will email. Provided wife with phone number to  reschedule MBSS to earlier date.   05/16/23: Patient's wife returned with lists of frequently used/functional words. SLP educated on purpose of communication book and started making with information provided. Patient able to talk through favorite restaurants and what he likes in each one. All questions answered to patient/spouse satisfaction. HEP sent home with start of communication book, and to practice at home.   05/12/23: Patient spouse states concerns in swallowing and word finding. Wants to find tools to put in place before things progress. Patient presented with difficulty word finding, word recall, and repetition. HEP given to create list of personally relevant information to create communication aids. Patient failed 3 oz swallow test by taking multiple sips. MBSS ordered with education provided on the test. Patient struggled following multi-step instructions and required max-A of repetition, immediate feedback, and visual and phonemic cues. Speak Out! Book ordered to address vocal volume and cognitive exercises.   PATIENT EDUCATION: Education details: see above.  Person educated: Patient and Spouse Education method: Explanation Education comprehension: verbalized understanding  GOALS: Goals reviewed with patient? Yes  SHORT TERM GOALS: Target date: 06/09/23  Patient will find 6 family members names with AAC/external aid and occasional mod-A.  Baseline: Goal status: MET  2.  Patient will use scripts for phones and functional conversation with occasional mod-A.  Baseline:  Goal status: Deferred.   3.  Patient will completed MBSS.  Baseline: Fail of 3 oz swallow test. Goal status: MET  4.  Pt will name 8 items in personally relevant category with AAC/external aids and occasional mod A Baseline:  Goal status: MET   LONG TERM GOALS: Target date: 07/07/23  Patient will use multi-modal communication to take three turns in a conversation with occasional min-A.  Baseline:  Goal  status: IN PROGRESS  2.  Patient will use multi-modal communication to make choices with occasional min-A  Baseline:  Goal status: IN PROGRESS  3.  Family will implement strategies, AAC to provide extra time, verbal cues to support pt's communication and participation in conversations with min-A.  Baseline:  Goal status: IN PROGRESS  4.  Patient and family will use external memory aids to complete ADLs with min-A.  Baseline:  Goal status: IN PROGRESS   ASSESSMENT:  CLINICAL IMPRESSION: Patient is a 77 y.o. M who was seen today for a cognition and language assessment. Neuropsych evaluation completed on 04/12/23 states patient is now displaying significant deficits for lexical and semantic verbal fluency, significant changes in executive functioning, significant decrease in information processing  speed and significant and profound deficits with regard to his capacity to learn new visual and auditory information. struggles with verbal comprehension, perceptual reasoning, working memory and processing speed (all rated extremely low). Based on PROM, patient struggles with management and planning on household responsibilities, remembering names of familiar names of family members and friend, and following simple and complex instructions. ST is recommended at this time to address cognitive decline and to put tools in place before his progression worsens, and to support family and caregivers.    OBJECTIVE IMPAIRMENTS: include attention, memory, awareness, executive functioning, expressive language, receptive language, and dysphagia. These impairments are limiting patient from ADLs/IADLs, effectively communicating at home and in community, and safety when swallowing. Factors affecting potential to achieve goals and functional outcome are severity of impairments. Patient will benefit from skilled SLP services to address above impairments and improve overall function.  REHAB POTENTIAL: Poor due to  progressive disease.   PLAN:  SLP FREQUENCY: 2x/week  SLP DURATION: 8 weeks  PLANNED INTERVENTIONS: Aspiration precaution training, Environmental controls, Cognitive reorganization, Internal/external aids, Multimodal communication approach, SLP instruction and feedback, and Compensatory strategies    [place], Student-SLP 06/13/2023, 1:20 PM

## 2023-06-14 ENCOUNTER — Encounter (HOSPITAL_COMMUNITY): Payer: PPO

## 2023-06-14 ENCOUNTER — Ambulatory Visit (HOSPITAL_COMMUNITY): Payer: PPO

## 2023-06-15 ENCOUNTER — Ambulatory Visit: Payer: PPO | Admitting: Speech Pathology

## 2023-06-20 ENCOUNTER — Ambulatory Visit: Payer: PPO | Admitting: Speech Pathology

## 2023-06-20 DIAGNOSIS — M542 Cervicalgia: Secondary | ICD-10-CM | POA: Diagnosis not present

## 2023-06-20 DIAGNOSIS — R1310 Dysphagia, unspecified: Secondary | ICD-10-CM | POA: Diagnosis not present

## 2023-06-20 DIAGNOSIS — M79669 Pain in unspecified lower leg: Secondary | ICD-10-CM | POA: Diagnosis not present

## 2023-06-20 DIAGNOSIS — R4701 Aphasia: Secondary | ICD-10-CM

## 2023-06-20 NOTE — Therapy (Signed)
OUTPATIENT SPEECH LANGUAGE PATHOLOGY TREATMENT   Patient Name: Alexander Cummings MRN: [id number] DOB:Dec 16, 1945, 77 y.o., male Today's Date: 06/20/2023  PCP: Johny Blamer, MD REFERRING PROVIDER: Dohmeier, Porfirio Mylar, MD  END OF SESSION:  End of Session - 06/20/23 1409     Visit Number 7    Number of Visits 17    Date for SLP Re-Evaluation 07/07/23    SLP Start Time 1409    SLP Stop Time  1445    SLP Time Calculation (min) 36 min    Activity Tolerance Patient tolerated treatment well              Past Medical History:  Diagnosis Date   Adhesive capsulitis of right shoulder 06/19/2019   BCC (basal cell carcinoma)    face   Carpal tunnel syndrome    right wrist   Kidney stones    Osteoarthritis    back, knees, neck   Recurrent upper respiratory infection (URI)    gets colds infrequently...   Tinea versicolor    Past Surgical History:  Procedure Laterality Date   COLONOSCOPY     LUMBAR LAMINECTOMY/DECOMPRESSION MICRODISCECTOMY  10/03/2011   Procedure: LUMBAR LAMINECTOMY/DECOMPRESSION MICRODISCECTOMY;  Surgeon: Hewitt Shorts;  Location: [facility];  Service: Neurosurgery;  Laterality: Left;  LEFT Lumbar four-five EXTRAFORAMINAL MICRODISKECTOMY   MOHS SURGERY  01/2014   right ctr  01/23/2019   TONSILLECTOMY     VASECTOMY     Patient Active Problem List   Diagnosis Date Noted   Dystonia of right hand 07/11/2022   Dementia (HCC) 07/11/2022   Aphasia determined by examination 12/10/2019   Dementia with behavioral disturbance (HCC) 12/10/2019   MRI of brain abnormal 10/29/2019   Generalized hyperreflexia 10/29/2019   Clumsiness on examination 10/29/2019   Apraxia 10/29/2019   Aphasia 10/29/2019   MCI (mild cognitive impairment) with memory loss 10/29/2019    ONSET DATE: 05/10/23   REFERRING DIAG:  R47.01 (ICD-10-CM) - Aphasia determined by examination  G30.9 (ICD-10-CM) - Alzheimer's disease, unspecified (CODE) (HCC)  R90.89 (ICD-10-CM) - MRI of brain  abnormal  G31.09,F02.B18 (ICD-10-CM) - 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 (HCC)    THERAPY DIAG:  Aphasia  Dysphagia, unspecified type  Rationale for Evaluation and Treatment: Habilitation  SUBJECTIVE:   SUBJECTIVE STATEMENT: Palliative care nurse did not come, but they ruled out Hospice.   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 No  FALLS: Has patient fallen in last 6 months?  Yes-Fell 06/10/23 down the stairs and took about 20 minutes to get back up.   LIVING ENVIRONMENT: Lives with: lives with their family and lives with their spouse Lives in: House/apartment  PLOF:  Level of assistance: Needed assistance with ADLs, Needed assistance with IADLS Employment: Retired  PATIENT GOALS: Spouse has concerns in swallowing progression, word finding.   OBJECTIVE:   DIAGNOSTIC FINDINGS:  04/12/23 Neuropsych progress note:  Patient and wife report that 4 years ago they began noting more changes in his overall gait and the fact that the patient is now using his left hand almost exclusively.  Tremors were noted to have begun roughly 4 to 5 years ago even when at the time he was rather healthy overall.  The patient denies any significant pain.  The patient has lost roughly 40 pounds of weight over the past several years without  purposeful attempts at weight loss.  He is now quite thin and frail.  The patient is having increasing difficulties with comprehension and understanding, executive functioning, visual-spatial awareness, word finding and word recall, expressive language changes, short-term memory difficulties and periods of confusion.  He is having increased difficulties managing ADLs particularly due to motor functioning and is overall weak and tends to be repetitive in thoughts and statements.  Essentially symptoms began to present roughly 4 years ago with the patient having MRI, PET scan and previous neuropsychological testing consistent with left brain atrophy, which is also consistent with right arm motor functioning and expressive language changes.  These changes are described as a gradual decline in capabilities that has been rather steady in change.  One of the biggest changes has been changes in his expressive language with significant difficulties with word finding.  Consistencies of difficulties and day-to-day functioning are typical although when he has a change of scenery and is away from his home he may have significant worsening of symptoms.   05/19/2023 MBSS: Clinical Impression: Clinical Impression: Mr. Linden presents with an oropharyngeal dysphagia marked by significant pharyngeal residue and generally silent, trace aspiration of most consistencies.  Notable is weak contracture of pharyngeal musculature, particularly poor pharyngeal stripping and reduced base of tongue propulsion, leading to accumulation of residue particularly in the valleculae and to lesser degree in pyriforms.  Thin viscosities passed more readily through the pharynx and UES, but were more likely to be aspirated. Thicker viscosities were more likely to sit in pharynx.   There was occasional escape of thinner viscosities into the nasopharynx. The epiglottis inverted over larynx intermittently; more often there was NOT full inversion, with tip  folded up against pharyngeal walls. Cued cough was weak.  Pt had difficulty executing chin tucks and head turns.  After the study, Mr. and Mrs. viewed the imaging and we discussed concerns for his progressing dysphagia.   COGNITION: Overall cognitive status: Impaired Areas of impairment:  Attention: Impaired: Sustained, Alternating, Divided Memory: Impaired: Long term Procedural Awareness: Impaired: Neglect Executive function: Impaired: Error awareness Functional deficits: cognitive deficits affecting IDLs-wife. Patient demonstrates awareness of cognitive loss, but will perseverate on reasons as to why he thinks this has occurred.    AUDITORY COMPREHENSION: Overall auditory comprehension: Impaired: simple, moderately complex, and complex YES/NO questions: Appears intact Following directions: Impaired: simple, moderately complex, and complex Conversation: Simple, Complex, and Moderately Complex Interfering components: attention, awareness, hearing, and processing speed Effective technique: pausing, repetition/stressing words, slowed speech, written cues, and stressing words  EXPRESSION: verbal  VERBAL EXPRESSION: Level of generative/spontaneous verbalization: word and phrase Repetition: Appears intact Pragmatics: Appears intact Interfering components:  Progressive Lewy body  Effective technique: semantic cues, sentence completion, phonemic cues, written cues, and melodic intonation Non-verbal means of communication: N/A  PERCEPTUAL VOICE ASSESSMENT: Voice quality: hoarse and low vocal intensity Resonance: normal Respiratory function: diaphragmatic/abdominal breathing   STANDARDIZED ASSESSMENTS: Standardized assessments not given at this time due to extensive cognitive testing completed 04/22/23  PATIENT REPORTED OUTCOME  MEASURES (PROM): Cognitive Function: Rated as "cannot do" in almost all areas including: keeping track of time, checking for financial documents,  planning/managing household chores.    TODAY'S TREATMENT:                                                                                                                                         DATE:   06/20/23: Patient received Speak Out book, and led through dysarthria strategies with lesson 1 and provided education on outline of program, and given a goal of 1 lesson a day. Encouraged to modify the lessons as needed due to patient's cognitive decline. Patient required frequent max-A of repeating instructions, modeling, and visual aids. Patient's reading comprehension decreased and found that white text on black background helps. Targeted dysarthria and word finding through functional scripts. All questions answered to patients satisfaction.    06/13/23: Patient's wife wants to cancel appointment on Thursday and states that they are going to go on more day trips while they are able to. She sees more changes of of patient becoming more tired during the day. Palliative care tomorrow (06/13/23), and education provided on what that appointment may look like. Patient fell over the weekend and took 20 minutes to get back up. Education provided on how PT could help in the future. Patient has switched to a pureed diet and has been intentional about using hard swallows during eating sessions, which they say have helped. Refreshed information/education on EMST, and led through 5 sets of 5 breaths during the session with occasional min-A of models and immediate feedback. Led through low tech AAC to practice word finding, patient required occasional min-A. Discussion led on getting help in the home to support spouse. Simple recipes given for dysphagia 3 diet recommendation. Spouse states that she has been adding protein powder to increase calories during meals.   05/25/23: SWALLOW: Reviewed swallow compensations and HEP. Patient and spouse able to demonstrate 3 trials with EMST device with min-A verbal cues to A in  improved cues on behalf of caregiver.  LANGUAGE: SLP led through low tech AAC and asked patient additional questions for elaborations. Pt able to participate in personally relevant conversation with 75% accuracy with use of low tech communication aid. Occasional cues provided to orient to aid. HEP sent home of using AAC with other family members as well as creating list of functional needs or breakdowns at home, to problem solve in future ST sessions. Future ST therapy will focus on functional routines, as well as word finding strategies. All questions answered to spouse satisfaction.   05/22/24: Patient completed swallow study and knows to switch to pureed foods with more liquid between meals. Education given on focusing on calorie dense foods. Education on managing risk factors for PNA: oral care, remain active, manage health conditions  Spouse had a question about feeding tube, and was given information on pros and cons  on a feeding tube and to consult with their doctor. SLP led through effortful swallow using 5 mL thin liquid. Patient was able to complete 7 effortful swallows with models, and cues. Effortful swallow demonstrated to be difficult for patient, and he required SLP help with feeding himself.SLP conducted assessment of MEP for EMST. Pt's maximum expiratory pressure today was 22 cm H2O. EMST lite device was started at 75% MEP at 16 cm H2O, however pt only able to complete x2 reps. Decreased pressure to 10 cm H2O, with pt able to successfully complete 3 repetitions x2. Based on perception of effort level, this setting appears appropriate at this time. SLP provided demonstration and verbal instruction to utilize EMST device. HEP given of effortful swallows as well as EMST device.  Caregiver coaching provided on cueing strategies to aid in pt's ability to complete exercises. All questions answered to patient satisfaction.   05/18/23: Patient's wife came with personal pictures to help create  personalized AAC device. SLP created family pages with names for each family member. Patient required frequent mod-A of semantic, visual, and phonemic cues to add in recall of family member names. Patient previously created emergency contact card that he carries with him on his phone. Patient able to practice with with low-tech AAC provided today which increased accuracy of word recall. Patient tolerated treatment well and thanked SLP for help. HEP of collecting more pictures with wife will email. Provided wife with phone number to reschedule MBSS to earlier date.   05/16/23: Patient's wife returned with lists of frequently used/functional words. SLP educated on purpose of communication book and started making with information provided. Patient able to talk through favorite restaurants and what he likes in each one. All questions answered to patient/spouse satisfaction. HEP sent home with start of communication book, and to practice at home.   05/12/23: Patient spouse states concerns in swallowing and word finding. Wants to find tools to put in place before things progress. Patient presented with difficulty word finding, word recall, and repetition. HEP given to create list of personally relevant information to create communication aids. Patient failed 3 oz swallow test by taking multiple sips. MBSS ordered with education provided on the test. Patient struggled following multi-step instructions and required max-A of repetition, immediate feedback, and visual and phonemic cues. Speak Out! Book ordered to address vocal volume and cognitive exercises.   PATIENT EDUCATION: Education details: see above.  Person educated: Patient and Spouse Education method: Explanation Education comprehension: verbalized understanding  GOALS: Goals reviewed with patient? Yes  SHORT TERM GOALS: Target date: 06/09/23  Patient will find 6 family members names with AAC/external aid and occasional mod-A.  Baseline: Goal  status: MET  2.  Patient will use scripts for phones and functional conversation with occasional mod-A.  Baseline:  Goal status: Deferred.   3.  Patient will completed MBSS.  Baseline: Fail of 3 oz swallow test. Goal status: MET  4.  Pt will name 8 items in personally relevant category with AAC/external aids and occasional mod A Baseline:  Goal status: MET   LONG TERM GOALS: Target date: 07/07/23  Patient will use multi-modal communication to take three turns in a conversation with occasional min-A.  Baseline:  Goal status: IN PROGRESS  2.  Patient will use multi-modal communication to make choices with occasional min-A  Baseline:  Goal status: IN PROGRESS  3.  Family will implement strategies, AAC to provide extra time, verbal cues to support pt's communication and participation in conversations with min-A.  Baseline:  Goal status: IN PROGRESS  4.  Patient and family will use external memory aids to complete ADLs with min-A.  Baseline:  Goal status: IN PROGRESS   ASSESSMENT:  CLINICAL IMPRESSION: Patient is a 77 y.o. M who was seen today for a cognition and language assessment. Neuropsych evaluation completed on 04/12/23 states patient is now displaying significant deficits for lexical and semantic verbal fluency, significant changes in executive functioning, significant decrease in information processing speed and significant and profound deficits with regard to his capacity to learn new visual and auditory information. struggles with verbal comprehension, perceptual reasoning, working memory and processing speed (all rated extremely low). Based on PROM, patient struggles with management and planning on household responsibilities, remembering names of familiar names of family members and friend, and following simple and complex instructions. ST is recommended at this time to address cognitive decline and to put tools in place before his progression worsens, and to support family  and caregivers.    OBJECTIVE IMPAIRMENTS: include attention, memory, awareness, executive functioning, expressive language, receptive language, and dysphagia. These impairments are limiting patient from ADLs/IADLs, effectively communicating at home and in community, and safety when swallowing. Factors affecting potential to achieve goals and functional outcome are severity of impairments. Patient will benefit from skilled SLP services to address above impairments and improve overall function.  REHAB POTENTIAL: Poor due to progressive disease.   PLAN:  SLP FREQUENCY: 2x/week  SLP DURATION: 8 weeks  PLANNED INTERVENTIONS: Aspiration precaution training, Environmental controls, Cognitive reorganization, Internal/external aids, Multimodal communication approach, SLP instruction and feedback, and Compensatory strategies    [place], Student-SLP 06/20/2023, 2:09 PM

## 2023-06-22 ENCOUNTER — Ambulatory Visit: Payer: PPO | Admitting: Speech Pathology

## 2023-06-22 DIAGNOSIS — R1310 Dysphagia, unspecified: Secondary | ICD-10-CM | POA: Diagnosis not present

## 2023-06-22 DIAGNOSIS — R41841 Cognitive communication deficit: Secondary | ICD-10-CM

## 2023-06-22 DIAGNOSIS — R4701 Aphasia: Secondary | ICD-10-CM

## 2023-06-22 NOTE — Therapy (Signed)
OUTPATIENT SPEECH LANGUAGE PATHOLOGY TREATMENT   Patient Name: Alexander Cummings MRN: [id number] DOB:1946/02/26, 77 y.o., male Today's Date: 06/22/2023  PCP: Johny Blamer, MD REFERRING PROVIDER: Dohmeier, Porfirio Mylar, MD  END OF SESSION:  End of Session - 06/22/23 1350     Visit Number 8    Number of Visits 17    Date for SLP Re-Evaluation 07/07/23    SLP Start Time 1319    SLP Stop Time  1400    SLP Time Calculation (min) 41 min    Activity Tolerance Patient tolerated treatment well             Past Medical History:  Diagnosis Date   Adhesive capsulitis of right shoulder 06/19/2019   BCC (basal cell carcinoma)    face   Carpal tunnel syndrome    right wrist   Kidney stones    Osteoarthritis    back, knees, neck   Recurrent upper respiratory infection (URI)    gets colds infrequently...   Tinea versicolor    Past Surgical History:  Procedure Laterality Date   COLONOSCOPY     LUMBAR LAMINECTOMY/DECOMPRESSION MICRODISCECTOMY  10/03/2011   Procedure: LUMBAR LAMINECTOMY/DECOMPRESSION MICRODISCECTOMY;  Surgeon: Hewitt Shorts;  Location: [facility];  Service: Neurosurgery;  Laterality: Left;  LEFT Lumbar four-five EXTRAFORAMINAL MICRODISKECTOMY   MOHS SURGERY  01/2014   right ctr  01/23/2019   TONSILLECTOMY     VASECTOMY     Patient Active Problem List   Diagnosis Date Noted   Dystonia of right hand 07/11/2022   Dementia (HCC) 07/11/2022   Aphasia determined by examination 12/10/2019   Dementia with behavioral disturbance (HCC) 12/10/2019   MRI of brain abnormal 10/29/2019   Generalized hyperreflexia 10/29/2019   Clumsiness on examination 10/29/2019   Apraxia 10/29/2019   Aphasia 10/29/2019   MCI (mild cognitive impairment) with memory loss 10/29/2019    ONSET DATE: 05/10/23   REFERRING DIAG:  R47.01 (ICD-10-CM) - Aphasia determined by examination  G30.9 (ICD-10-CM) - Alzheimer's disease, unspecified (CODE) (HCC)  R90.89 (ICD-10-CM) - MRI of brain  abnormal  G31.09,F02.B18 (ICD-10-CM) - 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 (HCC)    THERAPY DIAG:  Aphasia  Dysphagia, unspecified type  Cognitive communication deficit  Rationale for Evaluation and Treatment: Habilitation  SUBJECTIVE:   SUBJECTIVE STATEMENT: "I put all of our stuff in a binder. 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 No  FALLS: Has patient fallen in last 6 months?  Yes-Fell 06/10/23 down the stairs and took about 20 minutes to get back up.   LIVING ENVIRONMENT: Lives with: lives with their family and lives with their spouse Lives in: House/apartment  PLOF:  Level of assistance: Needed assistance with ADLs, Needed assistance with IADLS Employment: Retired  PATIENT GOALS: Spouse has concerns in swallowing progression, word finding.  OBJECTIVE:   DIAGNOSTIC FINDINGS:   04/12/23 Neuropsych progress note:  Patient and wife report that 4 years ago they began noting more changes in his overall gait and the fact that the patient is now using his left hand almost exclusively.  Tremors were noted to have begun roughly 4 to 5 years ago even when at the time he was rather healthy overall.  The patient denies any significant pain.  The patient has lost roughly 40 pounds of  weight over the past several years without purposeful attempts at weight loss.  He is now quite thin and frail.  The patient is having increasing difficulties with comprehension and understanding, executive functioning, visual-spatial awareness, word finding and word recall, expressive language changes, short-term memory difficulties and periods of confusion.  He is having increased difficulties managing ADLs particularly due to motor functioning and is overall weak and tends to be repetitive in thoughts and statements.  Essentially symptoms began to present roughly 4 years ago with the patient having MRI, PET scan and previous neuropsychological testing consistent with left brain atrophy, which is also consistent with right arm motor functioning and expressive language changes.  These changes are described as a gradual decline in capabilities that has been rather steady in change.  One of the biggest changes has been changes in his expressive language with significant difficulties with word finding.  Consistencies of difficulties and day-to-day functioning are typical although when he has a change of scenery and is away from his home he may have significant worsening of symptoms.   05/19/2023 MBSS: Clinical Impression: Clinical Impression: Mr. Almond presents with an oropharyngeal dysphagia marked by significant pharyngeal residue and generally silent, trace aspiration of most consistencies.  Notable is weak contracture of pharyngeal musculature, particularly poor pharyngeal stripping and reduced base of tongue propulsion, leading to accumulation of residue particularly in the valleculae and to lesser degree in pyriforms.  Thin viscosities passed more readily through the pharynx and UES, but were more likely to be aspirated. Thicker viscosities were more likely to sit in pharynx.   There was occasional escape of thinner viscosities into the nasopharynx. The epiglottis inverted over larynx intermittently; more  often there was NOT full inversion, with tip folded up against pharyngeal walls. Cued cough was weak.  Pt had difficulty executing chin tucks and head turns.  After the study, Mr. and Mrs. viewed the imaging and we discussed concerns for his progressing dysphagia.   COGNITION: Overall cognitive status: Impaired Areas of impairment:  Attention: Impaired: Sustained, Alternating, Divided Memory: Impaired: Long term Procedural Awareness: Impaired: Neglect Executive function: Impaired: Error awareness Functional deficits: cognitive deficits affecting IDLs-wife. Patient demonstrates awareness of cognitive loss, but will perseverate on reasons as to why he thinks this has occurred.    AUDITORY COMPREHENSION: Overall auditory comprehension: Impaired: simple, moderately complex, and complex YES/NO questions: Appears intact Following directions: Impaired: simple, moderately complex, and complex Conversation: Simple, Complex, and Moderately Complex Interfering components: attention, awareness, hearing, and processing speed Effective technique: pausing, repetition/stressing words, slowed speech, written cues, and stressing words  EXPRESSION: verbal  VERBAL EXPRESSION: Level of generative/spontaneous verbalization: word and phrase Repetition: Appears intact Pragmatics: Appears intact Interfering components:  Progressive Lewy body  Effective technique: semantic cues, sentence completion, phonemic cues, written cues, and melodic intonation Non-verbal means of communication: N/A  PERCEPTUAL VOICE ASSESSMENT: Voice quality: hoarse and low vocal intensity Resonance: normal Respiratory function: diaphragmatic/abdominal breathing   STANDARDIZED ASSESSMENTS: Standardized assessments not given at this time due to extensive cognitive  testing completed 04/22/23  PATIENT REPORTED OUTCOME MEASURES (PROM): Cognitive Function: Rated as "cannot do" in almost all areas including: keeping track of time,  checking for financial documents, planning/managing household chores.    TODAY'S TREATMENT:                                                                                                                                         DATE:   06/22/23: Patient was able to complete 2 reps of 5 with the EMST with max cues with frequent feedback, visual models. Education provided on EMST setting and device increased 15. Led through [employer] 1 of Speak Out and found modifications with additional visual aids to simplify directions Patient benefited with gesture cues, models, immediate feedback, and simple visual aids. Spouse has concerns with word finding, education provided on semantic feature analysis. Targeted word finding through cognitive exercise, patient able to name 1-2 words in a relative category with frequent max-A of visual aids, semantic, an phonemic cues.   06/20/23: Patient received Speak Out book, and led through dysarthria strategies with lesson 1 and provided education on outline of program, and given a goal of 1 lesson a day. Encouraged to modify the lessons as needed due to patient's cognitive decline. Patient required frequent max-A of repeating instructions, modeling, and visual aids. Patient's reading comprehension decreased and found that white text on black background helps. Targeted dysarthria and word finding through functional scripts. All questions answered to patients satisfaction.    06/13/23: Patient's wife wants to cancel appointment on Thursday and states that they are going to go on more day trips while they are able to. She sees more changes of of patient becoming more tired during the day. Palliative care tomorrow (06/13/23), and education provided on what that appointment may look like. Patient fell over the weekend and took 20 minutes to get back up. Education provided on how PT could help in the future. Patient has switched to a pureed diet and has been intentional about using hard  swallows during eating sessions, which they say have helped. Refreshed information/education on EMST, and led through 5 sets of 5 breaths during the session with occasional min-A of models and immediate feedback. Led through low tech AAC to practice word finding, patient required occasional min-A. Discussion led on getting help in the home to support spouse. Simple recipes given for dysphagia 3 diet recommendation. Spouse states that she has been adding protein powder to increase calories during meals.   05/25/23: SWALLOW: Reviewed swallow compensations and HEP. Patient and spouse able to demonstrate 3 trials with EMST device with min-A verbal cues to A in improved cues on behalf of caregiver.  LANGUAGE: SLP led through low tech AAC and asked patient additional questions for elaborations. Pt able to participate in personally relevant conversation with 75% accuracy with use of low tech communication aid. Occasional cues provided to orient to aid.  HEP sent home of using AAC with other family members as well as creating list of functional needs or breakdowns at home, to problem solve in future ST sessions. Future ST therapy will focus on functional routines, as well as word finding strategies. All questions answered to spouse satisfaction.   05/22/24: Patient completed swallow study and knows to switch to pureed foods with more liquid between meals. Education given on focusing on calorie dense foods. Education on managing risk factors for PNA: oral care, remain active, manage health conditions  Spouse had a question about feeding tube, and was given information on pros and cons on a feeding tube and to consult with their doctor. SLP led through effortful swallow using 5 mL thin liquid. Patient was able to complete 7 effortful swallows with models, and cues. Effortful swallow demonstrated to be difficult for patient, and he required SLP help with feeding himself.SLP conducted assessment of MEP for EMST. Pt's maximum  expiratory pressure today was 22 cm H2O. EMST lite device was started at 75% MEP at 16 cm H2O, however pt only able to complete x2 reps. Decreased pressure to 10 cm H2O, with pt able to successfully complete 3 repetitions x2. Based on perception of effort level, this setting appears appropriate at this time. SLP provided demonstration and verbal instruction to utilize EMST device. HEP given of effortful swallows as well as EMST device.  Caregiver coaching provided on cueing strategies to aid in pt's ability to complete exercises. All questions answered to patient satisfaction.   05/18/23: Patient's wife came with personal pictures to help create personalized AAC device. SLP created family pages with names for each family member. Patient required frequent mod-A of semantic, visual, and phonemic cues to add in recall of family member names. Patient previously created emergency contact card that he carries with him on his phone. Patient able to practice with with low-tech AAC provided today which increased accuracy of word recall. Patient tolerated treatment well and thanked SLP for help. HEP of collecting more pictures with wife will email. Provided wife with phone number to reschedule MBSS to earlier date.   05/16/23: Patient's wife returned with lists of frequently used/functional words. SLP educated on purpose of communication book and started making with information provided. Patient able to talk through favorite restaurants and what he likes in each one. All questions answered to patient/spouse satisfaction. HEP sent home with start of communication book, and to practice at home.   05/12/23: Patient spouse states concerns in swallowing and word finding. Wants to find tools to put in place before things progress. Patient presented with difficulty word finding, word recall, and repetition. HEP given to create list of personally relevant information to create communication aids. Patient failed 3 oz swallow test  by taking multiple sips. MBSS ordered with education provided on the test. Patient struggled following multi-step instructions and required max-A of repetition, immediate feedback, and visual and phonemic cues. Speak Out! Book ordered to address vocal volume and cognitive exercises.   PATIENT EDUCATION: Education details: see above.  Person educated: Patient and Spouse Education method: Explanation Education comprehension: verbalized understanding  GOALS: Goals reviewed with patient? Yes  SHORT TERM GOALS: Target date: 06/09/23  Patient will find 6 family members names with AAC/external aid and occasional mod-A.  Baseline: Goal status: MET  2.  Patient will use scripts for phones and functional conversation with occasional mod-A.  Baseline:  Goal status: Deferred.   3.  Patient will completed MBSS.  Baseline: Fail of 3  oz swallow test. Goal status: MET  4.  Pt will name 8 items in personally relevant category with AAC/external aids and occasional mod A Baseline:  Goal status: MET   LONG TERM GOALS: Target date: 07/07/23  Patient will use multi-modal communication to take three turns in a conversation with occasional min-A.  Baseline:  Goal status: IN PROGRESS  2.  Patient will use multi-modal communication to make choices with occasional min-A  Baseline:  Goal status: IN PROGRESS  3.  Family will implement strategies, AAC to provide extra time, verbal cues to support pt's communication and participation in conversations with min-A.  Baseline:  Goal status: IN PROGRESS  4.  Patient and family will use external memory aids to complete ADLs with min-A.  Baseline:  Goal status: IN PROGRESS   ASSESSMENT:  CLINICAL IMPRESSION: Patient is a 77 y.o. M who was seen today for a cognition and language assessment. Neuropsych evaluation completed on 04/12/23 states patient is now displaying significant deficits for lexical and semantic verbal fluency, significant changes in  executive functioning, significant decrease in information processing speed and significant and profound deficits with regard to his capacity to learn new visual and auditory information. struggles with verbal comprehension, perceptual reasoning, working memory and processing speed (all rated extremely low). Based on PROM, patient struggles with management and planning on household responsibilities, remembering names of familiar names of family members and friend, and following simple and complex instructions. ST is recommended at this time to address cognitive decline and to put tools in place before his progression worsens, and to support family and caregivers.    OBJECTIVE IMPAIRMENTS: include attention, memory, awareness, executive functioning, expressive language, receptive language, and dysphagia. These impairments are limiting patient from ADLs/IADLs, effectively communicating at home and in community, and safety when swallowing. Factors affecting potential to achieve goals and functional outcome are severity of impairments. Patient will benefit from skilled SLP services to address above impairments and improve overall function.  REHAB POTENTIAL: Poor due to progressive disease.   PLAN:  SLP FREQUENCY: 2x/week  SLP DURATION: 8 weeks  PLANNED INTERVENTIONS: Aspiration precaution training, Environmental controls, Cognitive reorganization, Internal/external aids, Multimodal communication approach, SLP instruction and feedback, and Compensatory strategies    [place], Student-SLP 06/22/2023, 7:58 AM

## 2023-06-22 NOTE — Patient Instructions (Signed)
EMST set to 15. Looking for one short blow and effort at 80%.   [employer] pocket

## 2023-06-27 ENCOUNTER — Ambulatory Visit: Payer: PPO | Admitting: Speech Pathology

## 2023-06-27 DIAGNOSIS — R1310 Dysphagia, unspecified: Secondary | ICD-10-CM | POA: Diagnosis not present

## 2023-06-27 DIAGNOSIS — M79669 Pain in unspecified lower leg: Secondary | ICD-10-CM | POA: Diagnosis not present

## 2023-06-27 DIAGNOSIS — M542 Cervicalgia: Secondary | ICD-10-CM | POA: Diagnosis not present

## 2023-06-27 DIAGNOSIS — R4701 Aphasia: Secondary | ICD-10-CM

## 2023-06-27 NOTE — Therapy (Signed)
OUTPATIENT SPEECH LANGUAGE PATHOLOGY TREATMENT   Patient Name: Alexander Cummings MRN: [id number] DOB:01-29-1946, 77 y.o., male Today's Date: 06/27/2023  PCP: Johny Blamer, MD REFERRING PROVIDER: Dohmeier, Porfirio Mylar, MD  END OF SESSION:  End of Session - 06/27/23 1319     Visit Number 9    Number of Visits 17    Date for SLP Re-Evaluation 07/07/23    SLP Start Time 1319    SLP Stop Time  1404    SLP Time Calculation (min) 45 min    Activity Tolerance Patient tolerated treatment well              Past Medical History:  Diagnosis Date   Adhesive capsulitis of right shoulder 06/19/2019   BCC (basal cell carcinoma)    face   Carpal tunnel syndrome    right wrist   Kidney stones    Osteoarthritis    back, knees, neck   Recurrent upper respiratory infection (URI)    gets colds infrequently...   Tinea versicolor    Past Surgical History:  Procedure Laterality Date   COLONOSCOPY     LUMBAR LAMINECTOMY/DECOMPRESSION MICRODISCECTOMY  10/03/2011   Procedure: LUMBAR LAMINECTOMY/DECOMPRESSION MICRODISCECTOMY;  Surgeon: Hewitt Shorts;  Location: [facility];  Service: Neurosurgery;  Laterality: Left;  LEFT Lumbar four-five EXTRAFORAMINAL MICRODISKECTOMY   MOHS SURGERY  01/2014   right ctr  01/23/2019   TONSILLECTOMY     VASECTOMY     Patient Active Problem List   Diagnosis Date Noted   Dystonia of right hand 07/11/2022   Dementia (HCC) 07/11/2022   Aphasia determined by examination 12/10/2019   Dementia with behavioral disturbance (HCC) 12/10/2019   MRI of brain abnormal 10/29/2019   Generalized hyperreflexia 10/29/2019   Clumsiness on examination 10/29/2019   Apraxia 10/29/2019   Aphasia 10/29/2019   MCI (mild cognitive impairment) with memory loss 10/29/2019    ONSET DATE: 05/10/23   REFERRING DIAG:  R47.01 (ICD-10-CM) - Aphasia determined by examination  G30.9 (ICD-10-CM) - Alzheimer's disease, unspecified (CODE) (HCC)  R90.89 (ICD-10-CM) - MRI of brain  abnormal  G31.09,F02.B18 (ICD-10-CM) - 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 (HCC)    THERAPY DIAG:  Aphasia  Rationale for Evaluation and Treatment: Habilitation  SUBJECTIVE:   SUBJECTIVE STATEMENT: "We took a break over the weekend." Pt accompanied by: significant other  PERTINENT HISTORY: Patient is a retired Pharmacist, hospital, 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 No  FALLS: Has patient fallen in last 6 months?  Yes-Fell 06/10/23 down the stairs and took about 20 minutes to get back up.   LIVING ENVIRONMENT: Lives with: lives with their family and lives with their spouse Lives in: House/apartment  PLOF:  Level of assistance: Needed assistance with ADLs, Needed assistance with IADLS Employment: Retired  PATIENT GOALS: Spouse has concerns in swallowing progression, word finding.   OBJECTIVE:   DIAGNOSTIC FINDINGS:   04/12/23 Neuropsych progress note:  Patient and wife report  that 4 years ago they began noting more changes in his overall gait and the fact that the patient is now using his left hand almost exclusively.  Tremors were noted to have begun roughly 4 to 5 years ago even when at the time he was rather healthy overall.  The patient denies any significant pain.  The patient has lost roughly 40 pounds of weight over the past several years without purposeful attempts at weight loss.  He is now quite thin and  frail.  The patient is having increasing difficulties with comprehension and understanding, executive functioning, visual-spatial awareness, word finding and word recall, expressive language changes, short-term memory difficulties and periods of confusion.  He is having increased difficulties managing ADLs particularly due to motor functioning and is overall weak and tends to be repetitive in thoughts and statements.  Essentially symptoms began to present roughly 4 years ago with the patient having MRI, PET scan and previous neuropsychological testing consistent with left brain atrophy, which is also consistent with right arm motor functioning and expressive language changes.  These changes are described as a gradual decline in capabilities that has been rather steady in change.  One of the biggest changes has been changes in his expressive language with significant difficulties with word finding.  Consistencies of difficulties and day-to-day functioning are typical although when he has a change of scenery and is away from his home he may have significant worsening of symptoms.   05/19/2023 MBSS: Clinical Impression: Clinical Impression: Mr. Kappel presents with an oropharyngeal dysphagia marked by significant pharyngeal residue and generally silent, trace aspiration of most consistencies.  Notable is weak contracture of pharyngeal musculature, particularly poor pharyngeal stripping and reduced base of tongue propulsion, leading to accumulation of residue particularly in the valleculae and to lesser degree in pyriforms.  Thin viscosities passed more readily through the pharynx and UES, but were more likely to be aspirated. Thicker viscosities were more likely to sit in pharynx.   There was occasional escape of thinner viscosities into the nasopharynx. The epiglottis inverted over larynx intermittently; more often there was NOT full inversion, with tip folded up against pharyngeal walls. Cued cough was weak.  Pt  had difficulty executing chin tucks and head turns.  After the study, Mr. and Mrs. viewed the imaging and we discussed concerns for his progressing dysphagia.   COGNITION: Overall cognitive status: Impaired Areas of impairment:  Attention: Impaired: Sustained, Alternating, Divided Memory: Impaired: Long term Procedural Awareness: Impaired: Neglect Executive function: Impaired: Error awareness Functional deficits: cognitive deficits affecting IDLs-wife. Patient demonstrates awareness of cognitive loss, but will perseverate on reasons as to why he thinks this has occurred.    AUDITORY COMPREHENSION: Overall auditory comprehension: Impaired: simple, moderately complex, and complex YES/NO questions: Appears intact Following directions: Impaired: simple, moderately complex, and complex Conversation: Simple, Complex, and Moderately Complex Interfering components: attention, awareness, hearing, and processing speed Effective technique: pausing, repetition/stressing words, slowed speech, written cues, and stressing words  EXPRESSION: verbal  VERBAL EXPRESSION: Level of generative/spontaneous verbalization: word and phrase Repetition: Appears intact Pragmatics: Appears intact Interfering components:  Progressive Lewy body  Effective technique: semantic cues, sentence completion, phonemic cues, written cues, and melodic intonation Non-verbal means of communication: N/A  PERCEPTUAL VOICE ASSESSMENT: Voice quality: hoarse and low vocal intensity Resonance: normal Respiratory function: diaphragmatic/abdominal breathing   STANDARDIZED ASSESSMENTS: Standardized assessments not given at this time due to extensive cognitive testing completed 04/22/23  PATIENT REPORTED OUTCOME MEASURES (PROM): Cognitive Function: Rated as "cannot do" in  almost all areas including: keeping track of time, checking for financial documents, planning/managing household chores.    TODAY'S TREATMENT:                                                                                                                                          DATE:   06/27/23: Review steps on intentional speaking and breath support through modified Speak Out! Education provided on caregiver giving pt simple, direct instruction with visual aids, and small steps with models and immediate feedback. Handout provided for low tech. AAC including daily to do list, as well as yearly schedule with visual aids. Created functional script page on low tech AAC to practice scripts before guests come over. Education provided on being intentional and creative with practice at home.   06/22/23: Patient was able to complete 2 reps of 5 with the EMST with max cues with frequent feedback, visual models. Education provided on EMST setting and device increased 15. Led through [employer] 1 of Speak Out and found modifications with additional visual aids to simplify directions Patient benefited with gesture cues, models, immediate feedback, and simple visual aids. Spouse has concerns with word finding, education provided on semantic feature analysis. Targeted word finding through cognitive exercise, patient able to name 1-2 words in a relative category with frequent max-A of visual aids, semantic, an phonemic cues.   06/20/23: Patient received Speak Out book, and led through dysarthria strategies with lesson 1 and provided education on outline of program, and given a goal of 1 lesson a day. Encouraged to modify the lessons as needed due to patient's cognitive decline. Patient required frequent max-A of repeating instructions, modeling, and visual aids. Patient's reading comprehension decreased and found that white text on black background helps. Targeted dysarthria and word finding through functional scripts. All questions answered to patients satisfaction.    06/13/23: Patient's wife wants to cancel appointment on Thursday and states that they are going to go on more day  trips while they are able to. She sees more changes of of patient becoming more tired during the day. Palliative care tomorrow (06/13/23), and education provided on what that appointment may look like. Patient fell over the weekend and took 20 minutes to get back up. Education provided on how PT could help in the future. Patient has switched to a pureed diet and has been intentional about using hard swallows during eating sessions, which they say have helped. Refreshed information/education on EMST, and led through 5 sets of 5 breaths during the session with occasional min-A of models and immediate feedback. Led through low tech AAC to practice word finding, patient required occasional min-A. Discussion led on getting help in the home to support spouse. Simple recipes given for dysphagia 3 diet recommendation. Spouse states that she has been adding protein powder to increase calories during meals.   05/25/23: SWALLOW: Reviewed swallow compensations and HEP.  Patient and spouse able to demonstrate 3 trials with EMST device with min-A verbal cues to A in improved cues on behalf of caregiver.  LANGUAGE: SLP led through low tech AAC and asked patient additional questions for elaborations. Pt able to participate in personally relevant conversation with 75% accuracy with use of low tech communication aid. Occasional cues provided to orient to aid. HEP sent home of using AAC with other family members as well as creating list of functional needs or breakdowns at home, to problem solve in future ST sessions. Future ST therapy will focus on functional routines, as well as word finding strategies. All questions answered to spouse satisfaction.   05/22/24: Patient completed swallow study and knows to switch to pureed foods with more liquid between meals. Education given on focusing on calorie dense foods. Education on managing risk factors for PNA: oral care, remain active, manage health conditions  Spouse had a question about  feeding tube, and was given information on pros and cons on a feeding tube and to consult with their doctor. SLP led through effortful swallow using 5 mL thin liquid. Patient was able to complete 7 effortful swallows with models, and cues. Effortful swallow demonstrated to be difficult for patient, and he required SLP help with feeding himself.SLP conducted assessment of MEP for EMST. Pt's maximum expiratory pressure today was 22 cm H2O. EMST lite device was started at 75% MEP at 16 cm H2O, however pt only able to complete x2 reps. Decreased pressure to 10 cm H2O, with pt able to successfully complete 3 repetitions x2. Based on perception of effort level, this setting appears appropriate at this time. SLP provided demonstration and verbal instruction to utilize EMST device. HEP given of effortful swallows as well as EMST device.  Caregiver coaching provided on cueing strategies to aid in pt's ability to complete exercises. All questions answered to patient satisfaction.   05/18/23: Patient's wife came with personal pictures to help create personalized AAC device. SLP created family pages with names for each family member. Patient required frequent mod-A of semantic, visual, and phonemic cues to add in recall of family member names. Patient previously created emergency contact card that he carries with him on his phone. Patient able to practice with with low-tech AAC provided today which increased accuracy of word recall. Patient tolerated treatment well and thanked SLP for help. HEP of collecting more pictures with wife will email. Provided wife with phone number to reschedule MBSS to earlier date.   05/16/23: Patient's wife returned with lists of frequently used/functional words. SLP educated on purpose of communication book and started making with information provided. Patient able to talk through favorite restaurants and what he likes in each one. All questions answered to patient/spouse satisfaction. HEP sent  home with start of communication book, and to practice at home.   05/12/23: Patient spouse states concerns in swallowing and word finding. Wants to find tools to put in place before things progress. Patient presented with difficulty word finding, word recall, and repetition. HEP given to create list of personally relevant information to create communication aids. Patient failed 3 oz swallow test by taking multiple sips. MBSS ordered with education provided on the test. Patient struggled following multi-step instructions and required max-A of repetition, immediate feedback, and visual and phonemic cues. Speak Out! Book ordered to address vocal volume and cognitive exercises.   PATIENT EDUCATION: Education details: see above.  Person educated: Patient and Spouse Education method: Explanation Education comprehension: verbalized understanding  GOALS: Goals  reviewed with patient? Yes  SHORT TERM GOALS: Target date: 06/09/23  Patient will find 6 family members names with AAC/external aid and occasional mod-A.  Baseline: Goal status: MET  2.  Patient will use scripts for phones and functional conversation with occasional mod-A.  Baseline:  Goal status: Deferred.   3.  Patient will completed MBSS.  Baseline: Fail of 3 oz swallow test. Goal status: MET  4.  Pt will name 8 items in personally relevant category with AAC/external aids and occasional mod A Baseline:  Goal status: MET   LONG TERM GOALS: Target date: 07/07/23  Patient will use multi-modal communication to take three turns in a conversation with occasional min-A.  Baseline:  Goal status: IN PROGRESS  2.  Patient will use multi-modal communication to make choices with occasional min-A  Baseline:  Goal status: IN PROGRESS  3.  Family will implement strategies, AAC to provide extra time, verbal cues to support pt's communication and participation in conversations with min-A.  Baseline:  Goal status: IN PROGRESS  4.  Patient  and family will use external memory aids to complete ADLs with min-A.  Baseline:  Goal status: IN PROGRESS   ASSESSMENT:  CLINICAL IMPRESSION: Patient is a 77 y.o. M who was seen today for a cognition and language assessment. Neuropsych evaluation completed on 04/12/23 states patient is now displaying significant deficits for lexical and semantic verbal fluency, significant changes in executive functioning, significant decrease in information processing speed and significant and profound deficits with regard to his capacity to learn new visual and auditory information. struggles with verbal comprehension, perceptual reasoning, working memory and processing speed (all rated extremely low). Based on PROM, patient struggles with management and planning on household responsibilities, remembering names of familiar names of family members and friend, and following simple and complex instructions. ST is recommended at this time to address cognitive decline and to put tools in place before his progression worsens, and to support family and caregivers.    OBJECTIVE IMPAIRMENTS: include attention, memory, awareness, executive functioning, expressive language, receptive language, and dysphagia. These impairments are limiting patient from ADLs/IADLs, effectively communicating at home and in community, and safety when swallowing. Factors affecting potential to achieve goals and functional outcome are severity of impairments. Patient will benefit from skilled SLP services to address above impairments and improve overall function.  REHAB POTENTIAL: Poor due to progressive disease.   PLAN:  SLP FREQUENCY: 2x/week  SLP DURATION: 8 weeks  PLANNED INTERVENTIONS: Aspiration precaution training, Environmental controls, Cognitive reorganization, Internal/external aids, Multimodal communication approach, SLP instruction and feedback, and Compensatory strategies    [place], Student-SLP 06/22/2023, 7:58  AM

## 2023-06-29 ENCOUNTER — Ambulatory Visit: Payer: PPO | Admitting: Speech Pathology

## 2023-06-29 ENCOUNTER — Emergency Department (HOSPITAL_COMMUNITY): Payer: PPO

## 2023-06-29 ENCOUNTER — Emergency Department (HOSPITAL_COMMUNITY)
Admission: EM | Admit: 2023-06-29 | Discharge: 2023-06-30 | Disposition: A | Discharge status: Home / Self Care | Payer: PPO | Attending: Emergency Medicine | Admitting: Emergency Medicine

## 2023-06-29 ENCOUNTER — Encounter (HOSPITAL_COMMUNITY): Payer: Self-pay

## 2023-06-29 ENCOUNTER — Other Ambulatory Visit: Payer: Self-pay

## 2023-06-29 DIAGNOSIS — R609 Edema, unspecified: Secondary | ICD-10-CM | POA: Diagnosis not present

## 2023-06-29 DIAGNOSIS — G20C Parkinsonism, unspecified: Secondary | ICD-10-CM | POA: Diagnosis not present

## 2023-06-29 DIAGNOSIS — R4182 Altered mental status, unspecified: Secondary | ICD-10-CM | POA: Diagnosis not present

## 2023-06-29 DIAGNOSIS — G3183 Dementia with Lewy bodies: Secondary | ICD-10-CM | POA: Diagnosis not present

## 2023-06-29 DIAGNOSIS — M19072 Primary osteoarthritis, left ankle and foot: Secondary | ICD-10-CM | POA: Diagnosis not present

## 2023-06-29 DIAGNOSIS — L03116 Cellulitis of left lower limb: Secondary | ICD-10-CM | POA: Insufficient documentation

## 2023-06-29 DIAGNOSIS — M7989 Other specified soft tissue disorders: Secondary | ICD-10-CM | POA: Diagnosis not present

## 2023-06-29 DIAGNOSIS — M25572 Pain in left ankle and joints of left foot: Secondary | ICD-10-CM | POA: Diagnosis not present

## 2023-06-29 DIAGNOSIS — G20A1 Parkinson's disease without dyskinesia, without mention of fluctuations: Secondary | ICD-10-CM | POA: Diagnosis not present

## 2023-06-29 DIAGNOSIS — F0280 Dementia in other diseases classified elsewhere without behavioral disturbance: Secondary | ICD-10-CM | POA: Diagnosis not present

## 2023-06-29 DIAGNOSIS — R531 Weakness: Secondary | ICD-10-CM | POA: Diagnosis not present

## 2023-06-29 DIAGNOSIS — M79672 Pain in left foot: Secondary | ICD-10-CM | POA: Diagnosis not present

## 2023-06-29 LAB — URIC ACID: Uric Acid, Serum: 4.3 mg/dL (ref 3.7–8.6)

## 2023-06-29 LAB — CBC WITH DIFFERENTIAL/PLATELET
Abs Immature Granulocytes: 0.04 10*3/uL (ref 0.00–0.07)
Basophils Absolute: 0 10*3/uL (ref 0.0–0.1)
Basophils Relative: 0 %
Eosinophils Absolute: 0 10*3/uL (ref 0.0–0.5)
Eosinophils Relative: 0 %
HCT: 41.4 % (ref 39.0–52.0)
Hemoglobin: 13.7 g/dL (ref 13.0–17.0)
Immature Granulocytes: 0 %
Lymphocytes Relative: 13 %
Lymphs Abs: 1.5 10*3/uL (ref 0.7–4.0)
MCH: 31.4 pg (ref 26.0–34.0)
MCHC: 33.1 g/dL (ref 30.0–36.0)
MCV: 94.7 fL (ref 80.0–100.0)
Monocytes Absolute: 1 10*3/uL (ref 0.1–1.0)
Monocytes Relative: 9 %
Neutro Abs: 8.5 10*3/uL — ABNORMAL HIGH (ref 1.7–7.7)
Neutrophils Relative %: 78 %
Platelets: 220 10*3/uL (ref 150–400)
RBC: 4.37 MIL/uL (ref 4.22–5.81)
RDW: 12.2 % (ref 11.5–15.5)
WBC: 11.2 10*3/uL — ABNORMAL HIGH (ref 4.0–10.5)
nRBC: 0 % (ref 0.0–0.2)

## 2023-06-29 LAB — COMPREHENSIVE METABOLIC PANEL
ALT: 13 U/L (ref 0–44)
AST: 16 U/L (ref 15–41)
Albumin: 3.6 g/dL (ref 3.5–5.0)
Alkaline Phosphatase: 75 U/L (ref 38–126)
Anion gap: 7 (ref 5–15)
BUN: 21 mg/dL (ref 8–23)
CO2: 27 mmol/L (ref 22–32)
Calcium: 9.9 mg/dL (ref 8.9–10.3)
Chloride: 103 mmol/L (ref 98–111)
Creatinine, Ser: 0.82 mg/dL (ref 0.61–1.24)
GFR, Estimated: >60 mL/min (ref >60)
Glucose, Bld: 113 mg/dL — ABNORMAL HIGH (ref 70–99)
Potassium: 3.9 mmol/L (ref 3.5–5.1)
Sodium: 137 mmol/L (ref 135–145)
Total Bilirubin: 1.8 mg/dL — ABNORMAL HIGH (ref 0.3–1.2)
Total Protein: 7.4 g/dL (ref 6.5–8.1)

## 2023-06-29 LAB — URINALYSIS, ROUTINE W REFLEX MICROSCOPIC
Bilirubin Urine: NEGATIVE
Glucose, UA: NEGATIVE mg/dL
Hgb urine dipstick: NEGATIVE
Ketones, ur: 5 mg/dL — AB
Leukocytes,Ua: NEGATIVE
Nitrite: NEGATIVE
Protein, ur: NEGATIVE mg/dL
Specific Gravity, Urine: 1.015 (ref 1.005–1.030)
pH: 7 (ref 5.0–8.0)

## 2023-06-29 MED ORDER — NAPROXEN 500 MG PO TABS: 500.0000 mg | ORAL_TABLET | Freq: Two times a day (BID) | ORAL | 0 refills | Status: AC

## 2023-06-29 MED ORDER — SODIUM CHLORIDE 0.9 % IV SOLN
2.0000 g | Freq: Once | INTRAVENOUS | Status: AC
  Administered 2023-06-30: 2 g via INTRAVENOUS
  Filled 2023-06-29: qty 20

## 2023-06-29 MED ORDER — SODIUM CHLORIDE 0.9 % IV BOLUS
1000.0000 mL | Freq: Once | INTRAVENOUS | Status: AC
  Administered 2023-06-29: 1000 mL via INTRAVENOUS

## 2023-06-29 MED ORDER — CEPHALEXIN 500 MG PO CAPS: 500.0000 mg | ORAL_CAPSULE | Freq: Four times a day (QID) | ORAL | 0 refills | Status: DC

## 2023-06-29 MED ORDER — KETOROLAC TROMETHAMINE 30 MG/ML IJ SOLN
15.0000 mg | Freq: Once | INTRAMUSCULAR | Status: AC
  Administered 2023-06-30: 15 mg via INTRAVENOUS
  Filled 2023-06-29: qty 1

## 2023-06-29 NOTE — Discharge Instructions (Signed)
Follow-up with her doctor Monday or Tuesday.  Return sooner if any problems

## 2023-06-29 NOTE — ED Provider Notes (Signed)
[place] EMERGENCY DEPARTMENT AT [facility] Provider Note   CSN: [id number] Arrival date & time: 06/29/23  1956     History  Chief Complaint  Patient presents with   Altered Mental Status    Alexander Cummings is a 77 y.o. male.  Patient with severe Parkinson disease.  His wife states that he has been more confused lately and his left foot is swollen and tender.  The history is provided by the patient and medical records. No language interpreter was used.  Altered Mental Status Presenting symptoms: disorientation   Severity:  Mild Most recent episode:  Today Episode history:  Multiple Timing:  Intermittent Progression:  Waxing and waning Chronicity:  Recurrent Context: not alcohol use   Associated symptoms: no abdominal pain        Home Medications Prior to Admission medications   Medication Sig Start Date End Date Taking? Authorizing Provider  cephALEXin (KEFLEX) 500 MG capsule Take 1 capsule (500 mg total) by mouth 4 (four) times daily. 06/29/23  Yes Bethann Berkshire, MD  naproxen (NAPROSYN) 500 MG tablet Take 1 tablet (500 mg total) by mouth 2 (two) times daily. 06/29/23  Yes Bethann Berkshire, MD  donepezil (ARICEPT) 10 MG tablet TAKE 1 TABLET BY MOUTH AT BEDTIME 04/03/23   Dohmeier, Porfirio Mylar, MD  gabapentin (NEURONTIN) 300 MG capsule Take 300 mg by mouth daily. 10/11/20   [provider]  memantine (NAMENDA XR) 21 MG CP24 24 hr capsule Take 1 capsule (21 mg total) by mouth daily. 07/11/22   Dohmeier, Porfirio Mylar, MD  memantine [facility]) 10 MG tablet Take 1 tablet by mouth twice daily 12/12/22   Dohmeier, Porfirio Mylar, MD  mirtazapine (REMERON) 7.5 MG tablet Take 7.5 mg by mouth at bedtime. 10/27/20   [provider]      Allergies    Patient has no known allergies.    Review of Systems   Review of Systems  Unable to perform ROS: Mental status change  Gastrointestinal:  Negative for abdominal pain.    Physical Exam Updated Vital Signs BP 136/74   Pulse  66   Temp 99.1 F (37.3 C) (Oral)   Resp 13   Ht 5\' 8"  (1.727 m)   Wt 52 kg   SpO2 100%   BMI 17.43 kg/m  Physical Exam Vitals and nursing note reviewed.  Constitutional:      Appearance: He is well-developed.  HENT:     Head: Normocephalic.     Mouth/Throat:     Mouth: Mucous membranes are moist.  Eyes:     General: No scleral icterus.    Conjunctiva/sclera: Conjunctivae normal.  Neck:     Thyroid: No thyromegaly.  Cardiovascular:     Rate and Rhythm: Normal rate and regular rhythm.     Heart sounds: No murmur heard.    No friction rub. No gallop.  Pulmonary:     Breath sounds: No stridor. No wheezing or rales.  Chest:     Chest wall: No tenderness.  Abdominal:     General: There is no distension.     Tenderness: There is no abdominal tenderness. There is no rebound.  Musculoskeletal:        General: Normal range of motion.     Cervical back: Neck supple.     Comments: Left foot swollen and tender  Lymphadenopathy:     Cervical: No cervical adenopathy.  Skin:    Findings: No erythema or rash.  Neurological:     Motor: No abnormal  muscle tone.     Coordination: Coordination normal.     Comments: Patient is oriented to person place  Psychiatric:        Behavior: Behavior normal.     ED Results / Procedures / Treatments   Labs (all labs ordered are listed, but only abnormal results are displayed) Labs Reviewed  CBC WITH DIFFERENTIAL/PLATELET - Abnormal; Notable for the following components:      Result Value   WBC 11.2 (*)    Neutro Abs 8.5 (*)    All other components within normal limits  COMPREHENSIVE METABOLIC PANEL - Abnormal; Notable for the following components:   Glucose, Bld 113 (*)    Total Bilirubin 1.8 (*)    All other components within normal limits  URINALYSIS, ROUTINE W REFLEX MICROSCOPIC - Abnormal; Notable for the following components:   Ketones, ur 5 (*)    All other components within normal limits  URIC ACID     EKG None  Radiology DG Foot Complete Left  Result Date: 06/29/2023 CLINICAL DATA:  Foot pain EXAM: LEFT FOOT - COMPLETE 3+ VIEW COMPARISON:  None Available. FINDINGS: No fracture or malalignment. Extensive vascular calcifications. Soft tissue swelling without emphysema or radiopaque foreign body. Mild degenerative change of the first MTP joint. IMPRESSION: 1. No acute osseous abnormality. 2. Extensive vascular calcifications. 3. Soft tissue swelling. Electronically Signed   By: Jasmine Pang M.D.   On: 06/29/2023 21:52   DG Ankle Complete Left  Result Date: 06/29/2023 CLINICAL DATA:  Swollen red EXAM: LEFT ANKLE COMPLETE - 3+ VIEW COMPARISON:  None Available. FINDINGS: Vascular calcifications. No fracture or malalignment. Mild degenerative changes. Soft tissue swelling. IMPRESSION: No acute osseous abnormality. Soft tissue swelling. Vascular calcifications. Electronically Signed   By: Jasmine Pang M.D.   On: 06/29/2023 21:51   CT Head Wo Contrast  Result Date: 06/29/2023 CLINICAL DATA:  Increasing confusion, dementia, Parkinson's disease EXAM: CT HEAD WITHOUT CONTRAST TECHNIQUE: Contiguous axial images were obtained from the base of the skull through the vertex without intravenous contrast. RADIATION DOSE REDUCTION: This exam was performed according to the departmental dose-optimization program which includes automated exposure control, adjustment of the mA and/or kV according to patient size and/or use of iterative reconstruction technique. COMPARISON:  07/20/2022 FINDINGS: Brain: No acute infarct or hemorrhage. Lateral ventricles and midline structures are unremarkable. No acute extra-axial fluid collections. No mass effect. Vascular: No hyperdense vessel or unexpected calcification. Skull: Normal. Negative for fracture or focal lesion. Sinuses/Orbits: No acute finding. Other: None. IMPRESSION: 1. No acute intracranial process. Electronically Signed   By: Sharlet Salina M.D.   On: 06/29/2023  21:26    Procedures Procedures    Medications Ordered in ED Medications  cefTRIAXone (ROCEPHIN) 2 g in sodium chloride 0.9 % 100 mL IVPB (has no administration in time range)  ketorolac (TORADOL) 30 MG/ML injection 15 mg (has no administration in time range)  sodium chloride 0.9 % bolus 1,000 mL (1,000 mLs Intravenous New Bag/Given 06/29/23 2239)    ED Course/ Medical Decision Making/ A&P                                 Medical Decision Making Amount and/or Complexity of Data Reviewed Labs: ordered. Radiology: ordered.  Risk Prescription drug management.    This patient presents to the ED for concern of more confusing and swelling left foot , this involves an extensive number of treatment options, and is  a complaint that carries with it a high risk of complications and morbidity.  The differential diagnosis includes worsening Parkinson disease, inflammation of the left foot possibly cellulitis or noninfected inflammation   Co morbidities that complicate the patient evaluation  Parkinson disease   Additional history obtained:  Additional history obtained from wife External records from outside source obtained and reviewed including hospital records   Lab Tests:  I Ordered, and personally interpreted labs.  The pertinent results include: White count 11.2   Imaging Studies ordered:  I ordered imaging studies including CT head and left foot I independently visualized and interpreted imaging which showed no acute disease I agree with the radiologist interpretation   Cardiac Monitoring: / EKG:  The patient was maintained on a cardiac monitor.  I personally viewed and interpreted the cardiac monitored which showed an underlying rhythm of: Normal sinus rhythm   Consultations Obtained:  No consultant Problem List / ED Course / Critical interventions / Medication management  Parkinson disease and swollen foot I ordered medication including antibiotics and  anti-inflammatories for left foot Reevaluation of the patient after these medicines showed that the patient stayed the same I have reviewed the patients home medicines and have made adjustments as needed   Social Determinants of Health:  None   Test / Admission - Considered:  None     Patient with worsening Parkinson disease and inflammatory process to his left foot.  He is covered with Keflex and Naprosyn and will follow-up early next week with his physician        Final Clinical Impression(s) / ED Diagnoses Final diagnoses:  Cellulitis of left foot excluding toes    Rx / DC Orders ED Discharge Orders          Ordered    cephALEXin (KEFLEX) 500 MG capsule  4 times daily        06/29/23 2349    naproxen (NAPROSYN) 500 MG tablet  2 times daily        06/29/23 2349              Bethann Berkshire, MD 07/01/23 1247

## 2023-06-29 NOTE — ED Triage Notes (Signed)
BIBA - per EMS, pts wife states the patient has been increasingly more confused today. Hx of dementia and parkinsons. Pt saw his pcp today who did evaluate him and per EMS ran some tests but the results aren't back yet. Pt does have LLE edema and LLE is warm to touch but no redness noted. PCP was concerned for gout.

## 2023-07-03 DIAGNOSIS — M25572 Pain in left ankle and joints of left foot: Secondary | ICD-10-CM | POA: Diagnosis not present

## 2023-07-04 ENCOUNTER — Ambulatory Visit: Payer: PPO | Attending: Neurology

## 2023-07-04 DIAGNOSIS — M542 Cervicalgia: Secondary | ICD-10-CM | POA: Diagnosis not present

## 2023-07-04 DIAGNOSIS — R4701 Aphasia: Secondary | ICD-10-CM | POA: Insufficient documentation

## 2023-07-04 DIAGNOSIS — R41841 Cognitive communication deficit: Secondary | ICD-10-CM | POA: Diagnosis not present

## 2023-07-04 DIAGNOSIS — M79669 Pain in unspecified lower leg: Secondary | ICD-10-CM | POA: Diagnosis not present

## 2023-07-04 DIAGNOSIS — R1312 Dysphagia, oropharyngeal phase: Secondary | ICD-10-CM | POA: Diagnosis not present

## 2023-07-04 NOTE — Therapy (Addendum)
OUTPATIENT SPEECH LANGUAGE PATHOLOGY TREATMENT   Patient Name: Alexander Cummings MRN: [id number] DOB:1945/12/24, 77 y.o., male Today's Date: 07/04/2023  PCP: Johny Blamer, MD REFERRING PROVIDER: Dohmeier, Porfirio Mylar, MD  END OF SESSION:  End of Session - 07/04/23 1322     Visit Number 10    Number of Visits 17    Date for SLP Re-Evaluation 07/07/23    SLP Start Time 1323    SLP Stop Time  1400    SLP Time Calculation (min) 37 min    Activity Tolerance Patient tolerated treatment well             Speech Therapy Progress Note  Dates of Reporting Period: 05-12-23 to current  Objective Reports of Subjective Statement: See tx notes below. Advancing towards goals per primary SLP report.  Objective Measurements: Low tech AAC created. Continued need to train and modify for optimal use d/t communication decline. MBSS completed (see report).   Goal Update: see goals below  Plan: continue per POC  Reason Skilled Services are Required: Pt would continue to benefit from skilled ST intervention to reduce communication breakdown and optimize swallow safety.    Past Medical History:  Diagnosis Date   Adhesive capsulitis of right shoulder 06/19/2019   BCC (basal cell carcinoma)    face   Carpal tunnel syndrome    right wrist   Kidney stones    Osteoarthritis    back, knees, neck   Recurrent upper respiratory infection (URI)    gets colds infrequently...   Tinea versicolor    Past Surgical History:  Procedure Laterality Date   COLONOSCOPY     LUMBAR LAMINECTOMY/DECOMPRESSION MICRODISCECTOMY  10/03/2011   Procedure: LUMBAR LAMINECTOMY/DECOMPRESSION MICRODISCECTOMY;  Surgeon: Hewitt Shorts;  Location: [facility];  Service: Neurosurgery;  Laterality: Left;  LEFT Lumbar four-five EXTRAFORAMINAL MICRODISKECTOMY   MOHS SURGERY  01/2014   right ctr  01/23/2019   TONSILLECTOMY     VASECTOMY     Patient Active Problem List   Diagnosis Date Noted   Dystonia of right hand  07/11/2022   Dementia (HCC) 07/11/2022   Aphasia determined by examination 12/10/2019   Dementia with behavioral disturbance (HCC) 12/10/2019   MRI of brain abnormal 10/29/2019   Generalized hyperreflexia 10/29/2019   Clumsiness on examination 10/29/2019   Apraxia 10/29/2019   Aphasia 10/29/2019   MCI (mild cognitive impairment) with memory loss 10/29/2019    ONSET DATE: 05/10/23   REFERRING DIAG:  R47.01 (ICD-10-CM) - Aphasia determined by examination  G30.9 (ICD-10-CM) - Alzheimer's disease, unspecified (CODE) (HCC)  R90.89 (ICD-10-CM) - MRI of brain abnormal  G31.09,F02.B18 (ICD-10-CM) - 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 (HCC)    THERAPY DIAG:  Aphasia  Cognitive communication deficit  Rationale for Evaluation and Treatment: Habilitation  SUBJECTIVE:   SUBJECTIVE STATEMENT: "He's declining" Pt accompanied by: significant other  PERTINENT HISTORY: Patient is a retired Pharmacist, hospital, 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  No  FALLS: Has patient fallen in last 6 months?  Yes-Fell 06/10/23 down the stairs and took about 20 minutes to get back up.   LIVING ENVIRONMENT: Lives with: lives with their family and lives with their spouse Lives in: House/apartment  PLOF:  Level of assistance: Needed assistance with ADLs, Needed assistance with IADLS Employment: Retired  PATIENT GOALS: Spouse has concerns in  swallowing progression, word finding.   OBJECTIVE:   DIAGNOSTIC FINDINGS:   04/12/23 Neuropsych progress note:  Patient and wife report that 4 years ago they began noting more changes in his overall gait and the fact that the patient is now using his left hand almost exclusively.  Tremors were noted to have begun roughly 4 to 5 years ago even when at the time he was rather healthy overall.  The patient denies any significant pain.  The patient has lost roughly 40 pounds of weight over the past several years without purposeful attempts at weight loss.  He is now quite thin and frail.  The patient is having increasing difficulties with comprehension and understanding, executive functioning, visual-spatial awareness, word finding and word recall, expressive language changes, short-term memory difficulties and periods of confusion.  He is having increased difficulties managing ADLs particularly due to motor functioning and is overall weak and tends to be repetitive in thoughts and statements.  Essentially symptoms began to present roughly 4 years ago with the patient having MRI, PET scan and previous neuropsychological testing consistent with left brain atrophy, which is also consistent with right arm motor functioning and expressive language changes.  These changes are described as a gradual decline in capabilities that has been rather steady in change.  One of the biggest changes has been changes in his expressive language with significant difficulties with word finding.  Consistencies of difficulties and day-to-day functioning are typical although when he has a change of scenery and is away from his home he may have significant worsening of symptoms.   05/19/2023 MBSS: Clinical Impression: Clinical Impression: Mr. Serrette presents with an oropharyngeal dysphagia marked by significant pharyngeal residue and generally silent, trace aspiration of most consistencies.  Notable is weak contracture of pharyngeal  musculature, particularly poor pharyngeal stripping and reduced base of tongue propulsion, leading to accumulation of residue particularly in the valleculae and to lesser degree in pyriforms.  Thin viscosities passed more readily through the pharynx and UES, but were more likely to be aspirated. Thicker viscosities were more likely to sit in pharynx.   There was occasional escape of thinner viscosities into the nasopharynx. The epiglottis inverted over larynx intermittently; more often there was NOT full inversion, with tip folded up against pharyngeal walls. Cued cough was weak.  Pt had difficulty executing chin tucks and head turns.  After the study, Mr. and Mrs. viewed the imaging and we discussed concerns for his progressing dysphagia.   TODAY'S TREATMENT:  DATE:   07/04/23: Returned with communication support. Targeted identifying icons for increased communication d/t aphasia. Pt able to ID targeted icon with ~95% accuracy and respond with appropriate icon given targeted situation with ~90% accuracy which improved with repetition and additional time. May benefit from additional visual aid for frequent places (gym, church, [facility]) and food options for meals. SLP provided education how to use visual supports at home to aid communication. Wife verbalized understanding. Provided communication card for PD but may benefit from a more individualized card related to lewy body.   06/27/23: Review steps on intentional speaking and breath support through modified Speak Out! Education provided on caregiver giving pt simple, direct instruction with visual aids, and small steps with models and immediate feedback. Handout provided for low tech. AAC including daily to do list, as well as yearly schedule with visual aids. Created functional script page on low tech AAC to practice scripts  before guests come over. Education provided on being intentional and creative with practice at home.   06/22/23: Patient was able to complete 2 reps of 5 with the EMST with max cues with frequent feedback, visual models. Education provided on EMST setting and device increased 15. Led through [employer] 1 of Speak Out and found modifications with additional visual aids to simplify directions Patient benefited with gesture cues, models, immediate feedback, and simple visual aids. Spouse has concerns with word finding, education provided on semantic feature analysis. Targeted word finding through cognitive exercise, patient able to name 1-2 words in a relative category with frequent max-A of visual aids, semantic, an phonemic cues.   06/20/23: Patient received Speak Out book, and led through dysarthria strategies with lesson 1 and provided education on outline of program, and given a goal of 1 lesson a day. Encouraged to modify the lessons as needed due to patient's cognitive decline. Patient required frequent max-A of repeating instructions, modeling, and visual aids. Patient's reading comprehension decreased and found that white text on black background helps. Targeted dysarthria and word finding through functional scripts. All questions answered to patients satisfaction.    06/13/23: Patient's wife wants to cancel appointment on Thursday and states that they are going to go on more day trips while they are able to. She sees more changes of of patient becoming more tired during the day. Palliative care tomorrow (06/13/23), and education provided on what that appointment may look like. Patient fell over the weekend and took 20 minutes to get back up. Education provided on how PT could help in the future. Patient has switched to a pureed diet and has been intentional about using hard swallows during eating sessions, which they say have helped. Refreshed information/education on EMST, and led through 5 sets of 5 breaths  during the session with occasional min-A of models and immediate feedback. Led through low tech AAC to practice word finding, patient required occasional min-A. Discussion led on getting help in the home to support spouse. Simple recipes given for dysphagia 3 diet recommendation. Spouse states that she has been adding protein powder to increase calories during meals.   05/25/23: SWALLOW: Reviewed swallow compensations and HEP. Patient and spouse able to demonstrate 3 trials with EMST device with min-A verbal cues to A in improved cues on behalf of caregiver.  LANGUAGE: SLP led through low tech AAC and asked patient additional questions for elaborations. Pt able to participate in personally relevant conversation with 75% accuracy with use of low tech communication aid. Occasional cues provided to orient to aid.  HEP sent home of using AAC with other family members as well as creating list of functional needs or breakdowns at home, to problem solve in future ST sessions. Future ST therapy will focus on functional routines, as well as word finding strategies. All questions answered to spouse satisfaction.   05/22/24: Patient completed swallow study and knows to switch to pureed foods with more liquid between meals. Education given on focusing on calorie dense foods. Education on managing risk factors for PNA: oral care, remain active, manage health conditions  Spouse had a question about feeding tube, and was given information on pros and cons on a feeding tube and to consult with their doctor. SLP led through effortful swallow using 5 mL thin liquid. Patient was able to complete 7 effortful swallows with models, and cues. Effortful swallow demonstrated to be difficult for patient, and he required SLP help with feeding himself.SLP conducted assessment of MEP for EMST. Pt's maximum expiratory pressure today was 22 cm H2O. EMST lite device was started at 75% MEP at 16 cm H2O, however pt only able to complete x2 reps.  Decreased pressure to 10 cm H2O, with pt able to successfully complete 3 repetitions x2. Based on perception of effort level, this setting appears appropriate at this time. SLP provided demonstration and verbal instruction to utilize EMST device. HEP given of effortful swallows as well as EMST device.  Caregiver coaching provided on cueing strategies to aid in pt's ability to complete exercises. All questions answered to patient satisfaction.   05/18/23: Patient's wife came with personal pictures to help create personalized AAC device. SLP created family pages with names for each family member. Patient required frequent mod-A of semantic, visual, and phonemic cues to add in recall of family member names. Patient previously created emergency contact card that he carries with him on his phone. Patient able to practice with with low-tech AAC provided today which increased accuracy of word recall. Patient tolerated treatment well and thanked SLP for help. HEP of collecting more pictures with wife will email. Provided wife with phone number to reschedule MBSS to earlier date.   05/16/23: Patient's wife returned with lists of frequently used/functional words. SLP educated on purpose of communication book and started making with information provided. Patient able to talk through favorite restaurants and what he likes in each one. All questions answered to patient/spouse satisfaction. HEP sent home with start of communication book, and to practice at home.   05/12/23: Patient spouse states concerns in swallowing and word finding. Wants to find tools to put in place before things progress. Patient presented with difficulty word finding, word recall, and repetition. HEP given to create list of personally relevant information to create communication aids. Patient failed 3 oz swallow test by taking multiple sips. MBSS ordered with education provided on the test. Patient struggled following multi-step instructions and  required max-A of repetition, immediate feedback, and visual and phonemic cues. Speak Out! Book ordered to address vocal volume and cognitive exercises.   PATIENT EDUCATION: Education details: see above.  Person educated: Patient and Spouse Education method: Explanation Education comprehension: verbalized understanding  GOALS: Goals reviewed with patient? Yes  SHORT TERM GOALS: Target date: 06/09/23  Patient will find 6 family members names with AAC/external aid and occasional mod-A.  Baseline: Goal status: MET  2.  Patient will use scripts for phones and functional conversation with occasional mod-A.  Baseline:  Goal status: Deferred.   3.  Patient will completed MBSS.  Baseline: Fail of 3  oz swallow test. Goal status: MET  4.  Pt will name 8 items in personally relevant category with AAC/external aids and occasional mod A Baseline:  Goal status: MET   LONG TERM GOALS: Target date: 07/07/23  Patient will use multi-modal communication to take three turns in a conversation with occasional min-A.  Baseline:  Goal status: IN PROGRESS  2.  Patient will use multi-modal communication to make choices with occasional min-A  Baseline:  Goal status: IN PROGRESS  3.  Family will implement strategies, AAC to provide extra time, verbal cues to support pt's communication and participation in conversations with min-A.  Baseline:  Goal status: IN PROGRESS  4.  Patient and family will use external memory aids to complete ADLs with min-A.  Baseline:  Goal status: IN PROGRESS   ASSESSMENT:  CLINICAL IMPRESSION: Patient is a 77 y.o. M who was seen today for a cognition and language assessment. Neuropsych evaluation completed on 04/12/23 states patient is now displaying significant deficits for lexical and semantic verbal fluency, significant changes in executive functioning, significant decrease in information processing speed and significant and profound deficits with regard to his  capacity to learn new visual and auditory information. struggles with verbal comprehension, perceptual reasoning, working memory and processing speed (all rated extremely low). Based on PROM, patient struggles with management and planning on household responsibilities, remembering names of familiar names of family members and friend, and following simple and complex instructions. ST is recommended at this time to address cognitive decline and to put tools in place before his progression worsens, and to support family and caregivers.    OBJECTIVE IMPAIRMENTS: include attention, memory, awareness, executive functioning, expressive language, receptive language, and dysphagia. These impairments are limiting patient from ADLs/IADLs, effectively communicating at home and in community, and safety when swallowing. Factors affecting potential to achieve goals and functional outcome are severity of impairments. Patient will benefit from skilled SLP services to address above impairments and improve overall function.  REHAB POTENTIAL: Poor due to progressive disease.   PLAN:  SLP FREQUENCY: 2x/week  SLP DURATION: 8 weeks  PLANNED INTERVENTIONS: Aspiration precaution training, Environmental controls, Cognitive reorganization, Internal/external aids, Multimodal communication approach, SLP instruction and feedback, and Compensatory strategies    Zena Amos, MA CCC-SLP 07-04-23

## 2023-07-06 ENCOUNTER — Ambulatory Visit: Payer: PPO | Admitting: Speech Pathology

## 2023-07-10 NOTE — Therapy (Unsigned)
OUTPATIENT SPEECH LANGUAGE PATHOLOGY TREATMENT   Patient Name: Alexander Cummings MRN: [id number] DOB:[birth year], 77 y.o., male Today's Date: 07/10/2023  PCP: Johny Blamer, MD REFERRING PROVIDER: Dohmeier, Porfirio Mylar, MD  END OF SESSION:    Speech Therapy Progress Note  Dates of Reporting Period: 05-12-23 to current  Objective Reports of Subjective Statement: See tx notes below. Advancing towards goals per primary SLP report.  Objective Measurements: Low tech AAC created. Continued need to train and modify for optimal use d/t communication decline. MBSS completed (see report).   Goal Update: see goals below  Plan: continue per POC  Reason Skilled Services are Required: Pt would continue to benefit from skilled ST intervention to reduce communication breakdown and optimize swallow safety.    Past Medical History:  Diagnosis Date   Adhesive capsulitis of right shoulder 06/19/2019   BCC (basal cell carcinoma)    face   Carpal tunnel syndrome    right wrist   Kidney stones    Osteoarthritis    back, knees, neck   Recurrent upper respiratory infection (URI)    gets colds infrequently...   Tinea versicolor    Past Surgical History:  Procedure Laterality Date   COLONOSCOPY     LUMBAR LAMINECTOMY/DECOMPRESSION MICRODISCECTOMY  10/03/2011   Procedure: LUMBAR LAMINECTOMY/DECOMPRESSION MICRODISCECTOMY;  Surgeon: Hewitt Shorts;  Location: [facility];  Service: Neurosurgery;  Laterality: Left;  LEFT Lumbar four-five EXTRAFORAMINAL MICRODISKECTOMY   MOHS SURGERY  01/2014   right ctr  01/23/2019   TONSILLECTOMY     VASECTOMY     Patient Active Problem List   Diagnosis Date Noted   Dystonia of right hand 07/11/2022   Dementia (HCC) 07/11/2022   Aphasia determined by examination 12/10/2019   Dementia with behavioral disturbance (HCC) 12/10/2019   MRI of brain abnormal 10/29/2019   Generalized hyperreflexia 10/29/2019   Clumsiness on examination 10/29/2019   Apraxia  10/29/2019   Aphasia 10/29/2019   MCI (mild cognitive impairment) with memory loss 10/29/2019    ONSET DATE: 05/10/23   REFERRING DIAG:  R47.01 (ICD-10-CM) - Aphasia determined by examination  G30.9 (ICD-10-CM) - Alzheimer's disease, unspecified (CODE) (HCC)  R90.89 (ICD-10-CM) - MRI of brain abnormal  G31.09,F02.B18 (ICD-10-CM) - 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 (HCC)    THERAPY DIAG:  No diagnosis found.  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 No  FALLS: Has patient fallen in last 6 months?  Yes-Fell 06/10/23 down the stairs and took about 20 minutes to get back up.   LIVING ENVIRONMENT: Lives with: lives with their family and lives with their spouse Lives in: House/apartment  PLOF:  Level of assistance: Needed assistance with ADLs, Needed assistance with IADLS Employment: Retired  PATIENT GOALS: Spouse has concerns in swallowing progression, word finding.   OBJECTIVE:  DIAGNOSTIC FINDINGS:   04/12/23 Neuropsych progress note:  Patient and wife report that 4 years ago they began noting more changes in his overall gait and the fact that the patient is now using his left hand almost exclusively.  Tremors were noted  to have begun roughly 4 to 5 years ago even when at the time he was rather healthy overall.  The patient denies any significant pain.  The patient has lost roughly 40 pounds of weight over the past several years without purposeful attempts at weight loss.  He is now quite thin and frail.  The patient is having increasing difficulties with comprehension and understanding, executive functioning, visual-spatial awareness, word finding and word recall, expressive language changes, short-term memory difficulties and periods of confusion.  He is having increased difficulties managing ADLs particularly due to motor functioning and is overall weak and tends to be repetitive in thoughts and statements.  Essentially symptoms began to present roughly 4 years ago with the patient having MRI, PET scan and previous neuropsychological testing consistent with left brain atrophy, which is also consistent with right arm motor functioning and expressive language changes.  These changes are described as a gradual decline in capabilities that has been rather steady in change.  One of the biggest changes has been changes in his expressive language with significant difficulties with word finding.  Consistencies of difficulties and day-to-day functioning are typical although when he has a change of scenery and is away from his home he may have significant worsening of symptoms.   05/19/2023 MBSS: Clinical Impression: Clinical Impression: Mr. Noor presents with an oropharyngeal dysphagia marked by significant pharyngeal residue and generally silent, trace aspiration of most consistencies.  Notable is weak contracture of pharyngeal musculature, particularly poor pharyngeal stripping and reduced base of tongue propulsion, leading to accumulation of residue particularly in the valleculae and to lesser degree in pyriforms.  Thin viscosities passed more readily through the pharynx and UES, but were more likely to be aspirated. Thicker  viscosities were more likely to sit in pharynx.   There was occasional escape of thinner viscosities into the nasopharynx. The epiglottis inverted over larynx intermittently; more often there was NOT full inversion, with tip folded up against pharyngeal walls. Cued cough was weak.  Pt had difficulty executing chin tucks and head turns.  After the study, Mr. and Mrs. viewed the imaging and we discussed concerns for his progressing dysphagia.   TODAY'S TREATMENT:                                                                                                                                         DATE:   07-11-23:  07/04/23: Returned with communication support. Targeted identifying icons for increased communication d/t aphasia. Pt able to ID targeted icon with ~95% accuracy and respond with appropriate icon given targeted situation with ~90% accuracy which improved with repetition and additional time.  May benefit from additional visual aid for frequent places (gym, church, [facility]) and food options for meals. SLP provided education how to use visual supports at home to aid communication. Wife verbalized understanding. Provided communication card for PD but may benefit from a more individualized card related to lewy body.   06/27/23: Review steps on intentional speaking and breath support through modified Speak Out! Education provided on caregiver giving pt simple, direct instruction with visual aids, and small steps with models and immediate feedback. Handout provided for low tech. AAC including daily to do list, as well as yearly schedule with visual aids. Created functional script page on low tech AAC to practice scripts before guests come over. Education provided on being intentional and creative with practice at home.   06/22/23: Patient was able to complete 2 reps of 5 with the EMST with max cues with frequent feedback, visual models. Education provided on EMST setting and device increased 15. Led  through [employer] 1 of Speak Out and found modifications with additional visual aids to simplify directions Patient benefited with gesture cues, models, immediate feedback, and simple visual aids. Spouse has concerns with word finding, education provided on semantic feature analysis. Targeted word finding through cognitive exercise, patient able to name 1-2 words in a relative category with frequent max-A of visual aids, semantic, an phonemic cues.   06/20/23: Patient received Speak Out book, and led through dysarthria strategies with lesson 1 and provided education on outline of program, and given a goal of 1 lesson a day. Encouraged to modify the lessons as needed due to patient's cognitive decline. Patient required frequent max-A of repeating instructions, modeling, and visual aids. Patient's reading comprehension decreased and found that white text on black background helps. Targeted dysarthria and word finding through functional scripts. All questions answered to patients satisfaction.    06/13/23: Patient's wife wants to cancel appointment on Thursday and states that they are going to go on more day trips while they are able to. She sees more changes of of patient becoming more tired during the day. Palliative care tomorrow (06/13/23), and education provided on what that appointment may look like. Patient fell over the weekend and took 20 minutes to get back up. Education provided on how PT could help in the future. Patient has switched to a pureed diet and has been intentional about using hard swallows during eating sessions, which they say have helped. Refreshed information/education on EMST, and led through 5 sets of 5 breaths during the session with occasional min-A of models and immediate feedback. Led through low tech AAC to practice word finding, patient required occasional min-A. Discussion led on getting help in the home to support spouse. Simple recipes given for dysphagia 3 diet recommendation. Spouse  states that she has been adding protein powder to increase calories during meals.   05/25/23: SWALLOW: Reviewed swallow compensations and HEP. Patient and spouse able to demonstrate 3 trials with EMST device with min-A verbal cues to A in improved cues on behalf of caregiver.  LANGUAGE: SLP led through low tech AAC and asked patient additional questions for elaborations. Pt able to participate in personally relevant conversation with 75% accuracy with use of low tech communication aid. Occasional cues provided to orient to aid. HEP sent home of using AAC with other family members as well as creating list of functional needs or breakdowns at home, to problem solve in future ST sessions. Future ST therapy will focus on functional routines, as well as word finding strategies.  All questions answered to spouse satisfaction.   05/22/24: Patient completed swallow study and knows to switch to pureed foods with more liquid between meals. Education given on focusing on calorie dense foods. Education on managing risk factors for PNA: oral care, remain active, manage health conditions  Spouse had a question about feeding tube, and was given information on pros and cons on a feeding tube and to consult with their doctor. SLP led through effortful swallow using 5 mL thin liquid. Patient was able to complete 7 effortful swallows with models, and cues. Effortful swallow demonstrated to be difficult for patient, and he required SLP help with feeding himself.SLP conducted assessment of MEP for EMST. Pt's maximum expiratory pressure today was 22 cm H2O. EMST lite device was started at 75% MEP at 16 cm H2O, however pt only able to complete x2 reps. Decreased pressure to 10 cm H2O, with pt able to successfully complete 3 repetitions x2. Based on perception of effort level, this setting appears appropriate at this time. SLP provided demonstration and verbal instruction to utilize EMST device. HEP given of effortful swallows as well as  EMST device.  Caregiver coaching provided on cueing strategies to aid in pt's ability to complete exercises. All questions answered to patient satisfaction.   05/18/23: Patient's wife came with personal pictures to help create personalized AAC device. SLP created family pages with names for each family member. Patient required frequent mod-A of semantic, visual, and phonemic cues to add in recall of family member names. Patient previously created emergency contact card that he carries with him on his phone. Patient able to practice with with low-tech AAC provided today which increased accuracy of word recall. Patient tolerated treatment well and thanked SLP for help. HEP of collecting more pictures with wife will email. Provided wife with phone number to reschedule MBSS to earlier date.   05/16/23: Patient's wife returned with lists of frequently used/functional words. SLP educated on purpose of communication book and started making with information provided. Patient able to talk through favorite restaurants and what he likes in each one. All questions answered to patient/spouse satisfaction. HEP sent home with start of communication book, and to practice at home.   05/12/23: Patient spouse states concerns in swallowing and word finding. Wants to find tools to put in place before things progress. Patient presented with difficulty word finding, word recall, and repetition. HEP given to create list of personally relevant information to create communication aids. Patient failed 3 oz swallow test by taking multiple sips. MBSS ordered with education provided on the test. Patient struggled following multi-step instructions and required max-A of repetition, immediate feedback, and visual and phonemic cues. Speak Out! Book ordered to address vocal volume and cognitive exercises.   PATIENT EDUCATION: Education details: see above.  Person educated: Patient and Spouse Education method: Explanation Education  comprehension: verbalized understanding  GOALS: Goals reviewed with patient? Yes  SHORT TERM GOALS: Target date: 06/09/23  Patient will find 6 family members names with AAC/external aid and occasional mod-A.  Baseline: Goal status: MET  2.  Patient will use scripts for phones and functional conversation with occasional mod-A.  Baseline:  Goal status: Deferred.   3.  Patient will completed MBSS.  Baseline: Fail of 3 oz swallow test. Goal status: MET  4.  Pt will name 8 items in personally relevant category with AAC/external aids and occasional mod A Baseline:  Goal status: MET   LONG TERM GOALS: Target date: 07/07/23  Patient will use multi-modal  communication to take three turns in a conversation with occasional min-A.  Baseline:  Goal status: IN PROGRESS  2.  Patient will use multi-modal communication to make choices with occasional min-A  Baseline:  Goal status: IN PROGRESS  3.  Family will implement strategies, AAC to provide extra time, verbal cues to support pt's communication and participation in conversations with min-A.  Baseline:  Goal status: IN PROGRESS  4.  Patient and family will use external memory aids to complete ADLs with min-A.  Baseline:  Goal status: IN PROGRESS   ASSESSMENT:  CLINICAL IMPRESSION: Patient is a 77 y.o. M who was seen today for a cognition and language assessment. Neuropsych evaluation completed on 04/12/23 states patient is now displaying significant deficits for lexical and semantic verbal fluency, significant changes in executive functioning, significant decrease in information processing speed and significant and profound deficits with regard to his capacity to learn new visual and auditory information. struggles with verbal comprehension, perceptual reasoning, working memory and processing speed (all rated extremely low). Based on PROM, patient struggles with management and planning on household responsibilities, remembering names of  familiar names of family members and friend, and following simple and complex instructions. ST is recommended at this time to address cognitive decline and to put tools in place before his progression worsens, and to support family and caregivers.    OBJECTIVE IMPAIRMENTS: include attention, memory, awareness, executive functioning, expressive language, receptive language, and dysphagia. These impairments are limiting patient from ADLs/IADLs, effectively communicating at home and in community, and safety when swallowing. Factors affecting potential to achieve goals and functional outcome are severity of impairments. Patient will benefit from skilled SLP services to address above impairments and improve overall function.  REHAB POTENTIAL: Poor due to progressive disease.   PLAN:  SLP FREQUENCY: 2x/week  SLP DURATION: 8 weeks  PLANNED INTERVENTIONS: Aspiration precaution training, Environmental controls, Cognitive reorganization, Internal/external aids, Multimodal communication approach, SLP instruction and feedback, and Compensatory strategies    [place], Student-SLP 06/22/2023, 7:58 AM

## 2023-07-11 ENCOUNTER — Ambulatory Visit: Payer: PPO

## 2023-07-11 DIAGNOSIS — R4701 Aphasia: Secondary | ICD-10-CM | POA: Diagnosis not present

## 2023-07-11 DIAGNOSIS — M79669 Pain in unspecified lower leg: Secondary | ICD-10-CM | POA: Diagnosis not present

## 2023-07-11 DIAGNOSIS — R41841 Cognitive communication deficit: Secondary | ICD-10-CM

## 2023-07-11 DIAGNOSIS — M542 Cervicalgia: Secondary | ICD-10-CM | POA: Diagnosis not present

## 2023-07-13 ENCOUNTER — Ambulatory Visit: Payer: PPO | Admitting: Speech Pathology

## 2023-07-13 DIAGNOSIS — R1312 Dysphagia, oropharyngeal phase: Secondary | ICD-10-CM

## 2023-07-13 DIAGNOSIS — R4701 Aphasia: Secondary | ICD-10-CM | POA: Diagnosis not present

## 2023-07-13 DIAGNOSIS — R41841 Cognitive communication deficit: Secondary | ICD-10-CM

## 2023-07-13 DIAGNOSIS — M79669 Pain in unspecified lower leg: Secondary | ICD-10-CM | POA: Diagnosis not present

## 2023-07-13 DIAGNOSIS — M542 Cervicalgia: Secondary | ICD-10-CM | POA: Diagnosis not present

## 2023-07-13 NOTE — Therapy (Signed)
OUTPATIENT SPEECH LANGUAGE PATHOLOGY TREATMENT ([facility])   Patient Name: Alexander Cummings MRN: [id number] DOB:[birth year], 77 y.o., male Today's Date: 07/13/2023  PCP: Johny Blamer, MD REFERRING PROVIDER: Dohmeier, Porfirio Mylar, MD  END OF SESSION:  End of Session - 07/13/23 1324     Visit Number 12    Number of Visits 17    Date for SLP Re-Evaluation 08/08/23    SLP Start Time 1324   pt arrived late   SLP Stop Time  1400    SLP Time Calculation (min) 36 min    Activity Tolerance Patient tolerated treatment well              Past Medical History:  Diagnosis Date   Adhesive capsulitis of right shoulder 06/19/2019   BCC (basal cell carcinoma)    face   Carpal tunnel syndrome    right wrist   Kidney stones    Osteoarthritis    back, knees, neck   Recurrent upper respiratory infection (URI)    gets colds infrequently...   Tinea versicolor    Past Surgical History:  Procedure Laterality Date   COLONOSCOPY     LUMBAR LAMINECTOMY/DECOMPRESSION MICRODISCECTOMY  10/03/2011   Procedure: LUMBAR LAMINECTOMY/DECOMPRESSION MICRODISCECTOMY;  Surgeon: Hewitt Shorts;  Location: [facility];  Service: Neurosurgery;  Laterality: Left;  LEFT Lumbar four-five EXTRAFORAMINAL MICRODISKECTOMY   MOHS SURGERY  01/2014   right ctr  01/23/2019   TONSILLECTOMY     VASECTOMY     Patient Active Problem List   Diagnosis Date Noted   Dystonia of right hand 07/11/2022   Dementia (HCC) 07/11/2022   Aphasia determined by examination 12/10/2019   Dementia with behavioral disturbance (HCC) 12/10/2019   MRI of brain abnormal 10/29/2019   Generalized hyperreflexia 10/29/2019   Clumsiness on examination 10/29/2019   Apraxia 10/29/2019   Aphasia 10/29/2019   MCI (mild cognitive impairment) with memory loss 10/29/2019    ONSET DATE: 05/10/23   REFERRING DIAG:  R47.01 (ICD-10-CM) - Aphasia determined by examination  G30.9 (ICD-10-CM) - Alzheimer's disease, unspecified (CODE) (HCC)  R90.89  (ICD-10-CM) - MRI of brain abnormal  G31.09,F02.B18 (ICD-10-CM) - 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 (HCC)    THERAPY DIAG:  Aphasia  Cognitive communication deficit  Dysphagia, oropharyngeal phase  Rationale for Evaluation and Treatment: Habilitation  SUBJECTIVE:   SUBJECTIVE STATEMENT: Pt attends with spouse, they report upcoming trip to the beach  PERTINENT HISTORY: Patient is a retired Pharmacist, hospital, 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 No  FALLS: Has patient fallen in last 6 months?  Yes-Fell 06/10/23 down the stairs and took about 20 minutes to get back up.   LIVING ENVIRONMENT: Lives with: lives with their family and lives with their spouse Lives in: House/apartment  PLOF:  Level of assistance: Needed assistance with ADLs, Needed assistance with IADLS Employment: Retired  PATIENT GOALS: Spouse has concerns in swallowing progression, word finding.   OBJECTIVE:   DIAGNOSTIC  FINDINGS:   04/12/23 Neuropsych progress note:  Patient and wife report that 4 years ago they began noting more changes in his overall gait and the fact that the patient is now using his left hand almost exclusively.  Tremors were noted to have begun roughly 4 to 5 years ago even when at the time he was rather healthy overall.  The patient denies any significant pain.  The patient has lost roughly 40 pounds of weight over the past  several years without purposeful attempts at weight loss.  He is now quite thin and frail.  The patient is having increasing difficulties with comprehension and understanding, executive functioning, visual-spatial awareness, word finding and word recall, expressive language changes, short-term memory difficulties and periods of confusion.  He is having increased difficulties managing ADLs particularly due to motor functioning and is overall weak and tends to be repetitive in thoughts and statements.  Essentially symptoms began to present roughly 4 years ago with the patient having MRI, PET scan and previous neuropsychological testing consistent with left brain atrophy, which is also consistent with right arm motor functioning and expressive language changes.  These changes are described as a gradual decline in capabilities that has been rather steady in change.  One of the biggest changes has been changes in his expressive language with significant difficulties with word finding.  Consistencies of difficulties and day-to-day functioning are typical although when he has a change of scenery and is away from his home he may have significant worsening of symptoms.   05/19/2023 MBSS: Clinical Impression: Clinical Impression: Mr. Lambert presents with an oropharyngeal dysphagia marked by significant pharyngeal residue and generally silent, trace aspiration of most consistencies.  Notable is weak contracture of pharyngeal musculature, particularly poor pharyngeal stripping and reduced base of tongue propulsion, leading to accumulation of residue particularly in the valleculae and to lesser degree in pyriforms.  Thin viscosities passed more readily through the pharynx and UES, but were more likely to be aspirated. Thicker viscosities were more likely to sit in pharynx.   There was occasional escape of thinner viscosities into the nasopharynx. The epiglottis inverted over larynx intermittently; more often there was NOT full  inversion, with tip folded up against pharyngeal walls. Cued cough was weak.  Pt had difficulty executing chin tucks and head turns.  After the study, Mr. and Mrs. viewed the imaging and we discussed concerns for his progressing dysphagia.   TODAY'S TREATMENT:                                                                                                                                         DATE:   07/13/23: Reviewed EMST swallow exercise, with pt able to demonstrate 15 repetitions with usual verbal cues. Evidencing strong forceful exhale. This exercises appears the most easy to pt to complete vs effortful swallows d/t comprehension. Reviewed recommendations for use and expansion of communication  aid to support involvement in conversations, especially as determination is expected. Pt successful participates in conversation with wife and SLP, taking x4 turns. Some word finding challenges which resolve given extended time. Discussion on use of visual aids to support semantic knowledge and participation in home. Spouse endorsing challenges in participation in ADLs, would benefit from OT to address and learn strategies to support pt. Request will be communicated to referring provider.   PATIENT EDUCATION: Education details: see above.  Person educated: Patient and Spouse Education method: Explanation Education comprehension: verbalized understanding  GOALS: Goals reviewed with patient? Yes  SHORT TERM GOALS: Target date: 06/09/23  Patient will find 6 family members names with AAC/external aid and occasional mod-A.  Baseline: Goal status: MET  2.  Patient will use scripts for phones and functional conversation with occasional mod-A.  Baseline:  Goal status: Deferred.   3.  Patient will completed MBSS.  Baseline: Fail of 3 oz swallow test. Goal status: MET  4.  Pt will name 8 items in personally relevant category with AAC/external aids and occasional mod A Baseline:  Goal status:  MET   LONG TERM GOALS: Target date: 08/08/2023 (recert date)  Patient will use multi-modal communication to take three turns in a conversation with occasional min-A.  Baseline:  Goal status: MET  2.  Patient will use multi-modal communication to make choices with occasional min-A  Baseline:  Goal status: MET  3.  Family will implement strategies, AAC to provide extra time, verbal cues to support pt's communication and participation in conversations with min-A.  Baseline:  Goal status: PARTIALLY MET  4.  Patient and family will use external memory aids to complete ADLs with min-A.  Baseline:  Goal status: NOT MET   ASSESSMENT:  CLINICAL IMPRESSION: Pt, with support of spouse, has implemented communication book with pages to address common items/places/etc, preferred communication topics, orientation. Pt has been able to utilize book to support conversation with SLP across multiple sessions. Ongoing education was required to enhance understanding of rationale for communication support outside of therapy sessions. Simple dysphagia exercise program iniitation with consdieration for pt's cognitive deficits. Diet modifications have been helpful at home, with pt eating pureed diet. Pt and spouse are agreeable to ST d/c at this time, are interested in OT referral to address participation and support for pt in ADLs.   OBJECTIVE IMPAIRMENTS: include attention, memory, awareness, executive functioning, expressive language, receptive language, and dysphagia. These impairments are limiting patient from ADLs/IADLs, effectively communicating at home and in community, and safety when swallowing. Factors affecting potential to achieve goals and functional outcome are severity of impairments. Patient will benefit from skilled SLP services to address above impairments and improve overall function.  REHAB POTENTIAL: Poor due to progressive disease.   PLAN:  SLP FREQUENCY: 2x/week  SLP DURATION: 8  weeks (+ 4 weeks for recert)  PLANNED INTERVENTIONS: Aspiration precaution training, Environmental controls, Cognitive reorganization, Internal/external aids, Multimodal communication approach, SLP instruction and feedback, and Compensatory strategies  SPEECH THERAPY DISCHARGE SUMMARY  Visits from Start of Care: 12  Current functional level related to goals / functional outcomes: Ongoing cognitive deficits and communication impairment d/t progressive disease. Pt and spouse have implemented strategies and communication supports recommended over therapy course and are pleased with current status. Pt is now eating pureed/thin diet.    Remaining deficits: Dementia with language component, dysarthria, dysphagia   Education / Equipment: Dysphagia HEP, diet modification, safe swallow strategies, use of visual aids, development and employment of communication support  Patient agrees to discharge. Patient goals were partially met. Patient is being discharged due to being pleased with the current functional level.Link Snuffer, CCC-SLP 2:02 PM 07/13/2023

## 2023-07-14 ENCOUNTER — Other Ambulatory Visit: Payer: Self-pay | Admitting: Neurology

## 2023-07-18 ENCOUNTER — Ambulatory Visit: Payer: PPO | Admitting: Speech Pathology

## 2023-07-20 ENCOUNTER — Encounter: Payer: PPO | Admitting: Speech Pathology

## 2023-07-20 ENCOUNTER — Telehealth: Payer: Self-pay | Admitting: Neurology

## 2023-07-20 NOTE — Telephone Encounter (Signed)
Called the patient's wife and advised after discussing with Dr Dohmeier she doesn't feel that there is anything she would offer medication wise. Wife states that hospice came out and did an assessment and they will have a nurse come our early next week. For now, I have kept the patient on the books for the upcoming apt but we can reassess an cancel if we need to.

## 2023-07-20 NOTE — Telephone Encounter (Signed)
Pt's wife called needing to speak to the RN to discuss significant changes in the pt's dementia. Please advise.

## 2023-07-20 NOTE — Telephone Encounter (Signed)
Called and spoke with the wife. Pt went to the ER 06/29/2023 with all of a sudden fever and they ended up completing a work up but couldn't find anything per the wife. There was some discussion in ER notes about cellulitis. Pt was dc home. Since being home week ago he could walk up and down the steps but in the last week he has had worsening decline in health. States that she has to assist him with walking and even then it is very difficult. States that he has began to get really stiff and freezing spells which he didn't have before. He is not on medication and has not had to be on medication to help with freezing spells in the past. She states she has reached out about a hospital bed. He is cognitively declining as well. The pt wife is asking about re-instating the hospice referral because of the decline.  I have also got the patient on schedule for sooner apt but she wanted to just discuss with Dr Dohmeier the decline and determine if something else is needed medication wise to help with the freezing spells. Will replace the referral to hospice and will discuss concerns with Dr Vickey Huger.

## 2023-07-25 ENCOUNTER — Encounter: Payer: PPO | Admitting: Speech Pathology

## 2023-07-27 ENCOUNTER — Encounter: Payer: PPO | Admitting: Speech Pathology

## 2023-08-01 ENCOUNTER — Ambulatory Visit: Payer: PPO | Admitting: Neurology

## 2023-08-28 ENCOUNTER — Ambulatory Visit: Payer: PPO | Admitting: Neurology

## 2023-08-29 DEATH — deceased

## 2024-04-05 IMAGING — CR DG ELBOW 2V*R*
2 series · 2 of 2 positions shown · non-contrast
Comparison: None Available.

CLINICAL DATA: Elbow pain unable to straighten, history of fall

EXAM:
RIGHT ELBOW - 2 VIEW

[x elbow lat right]
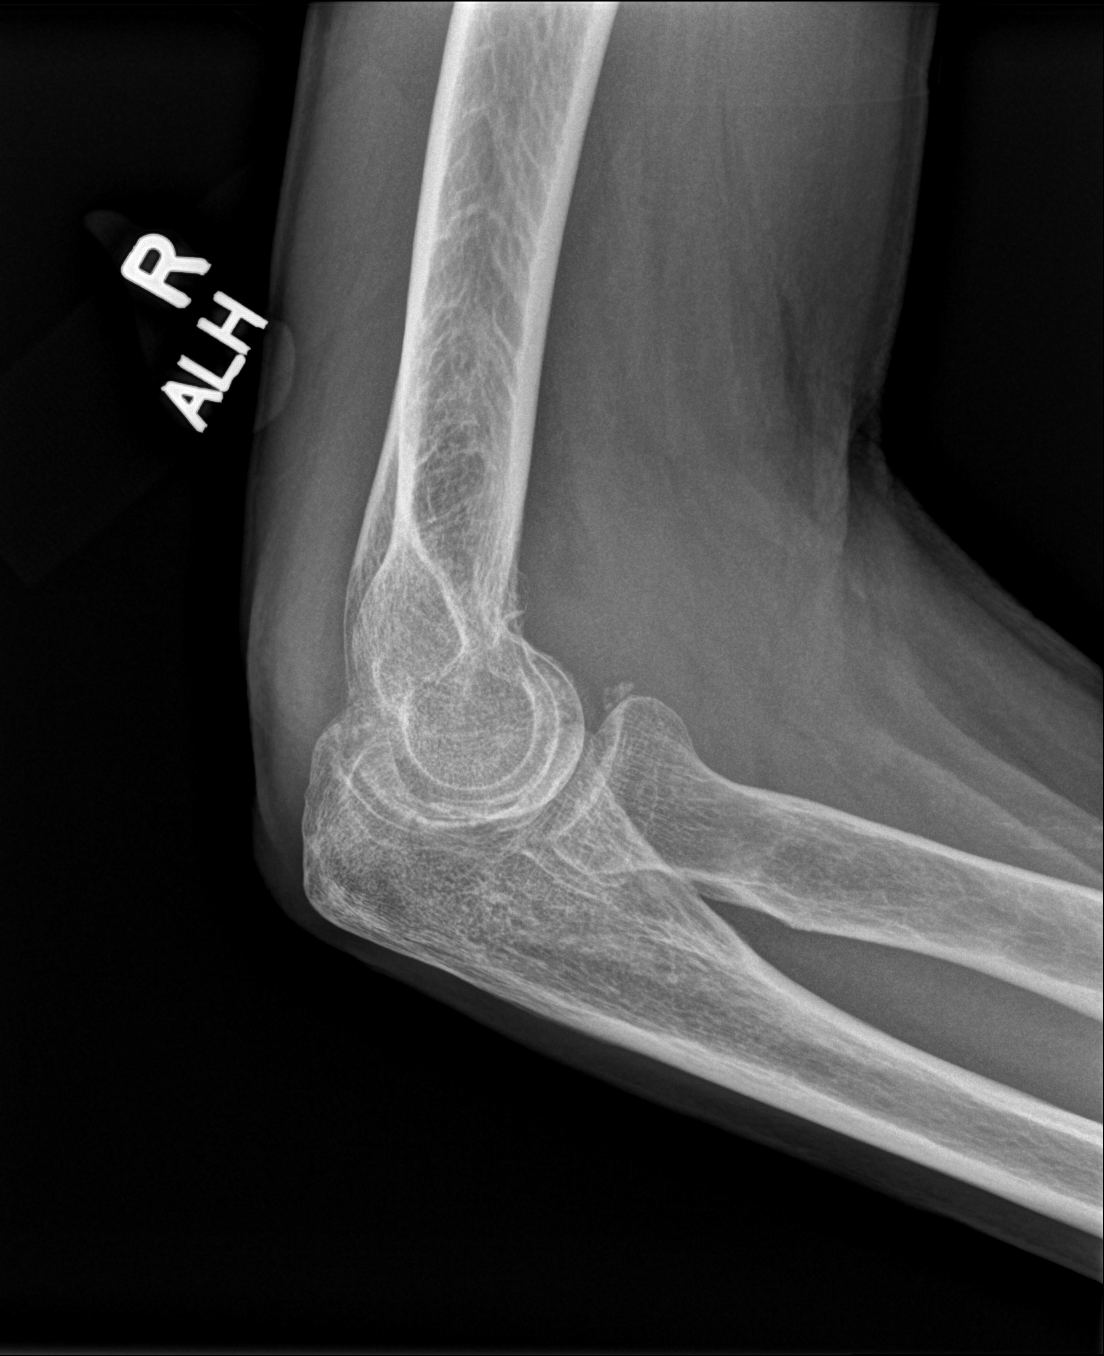

[x elbow ap right]
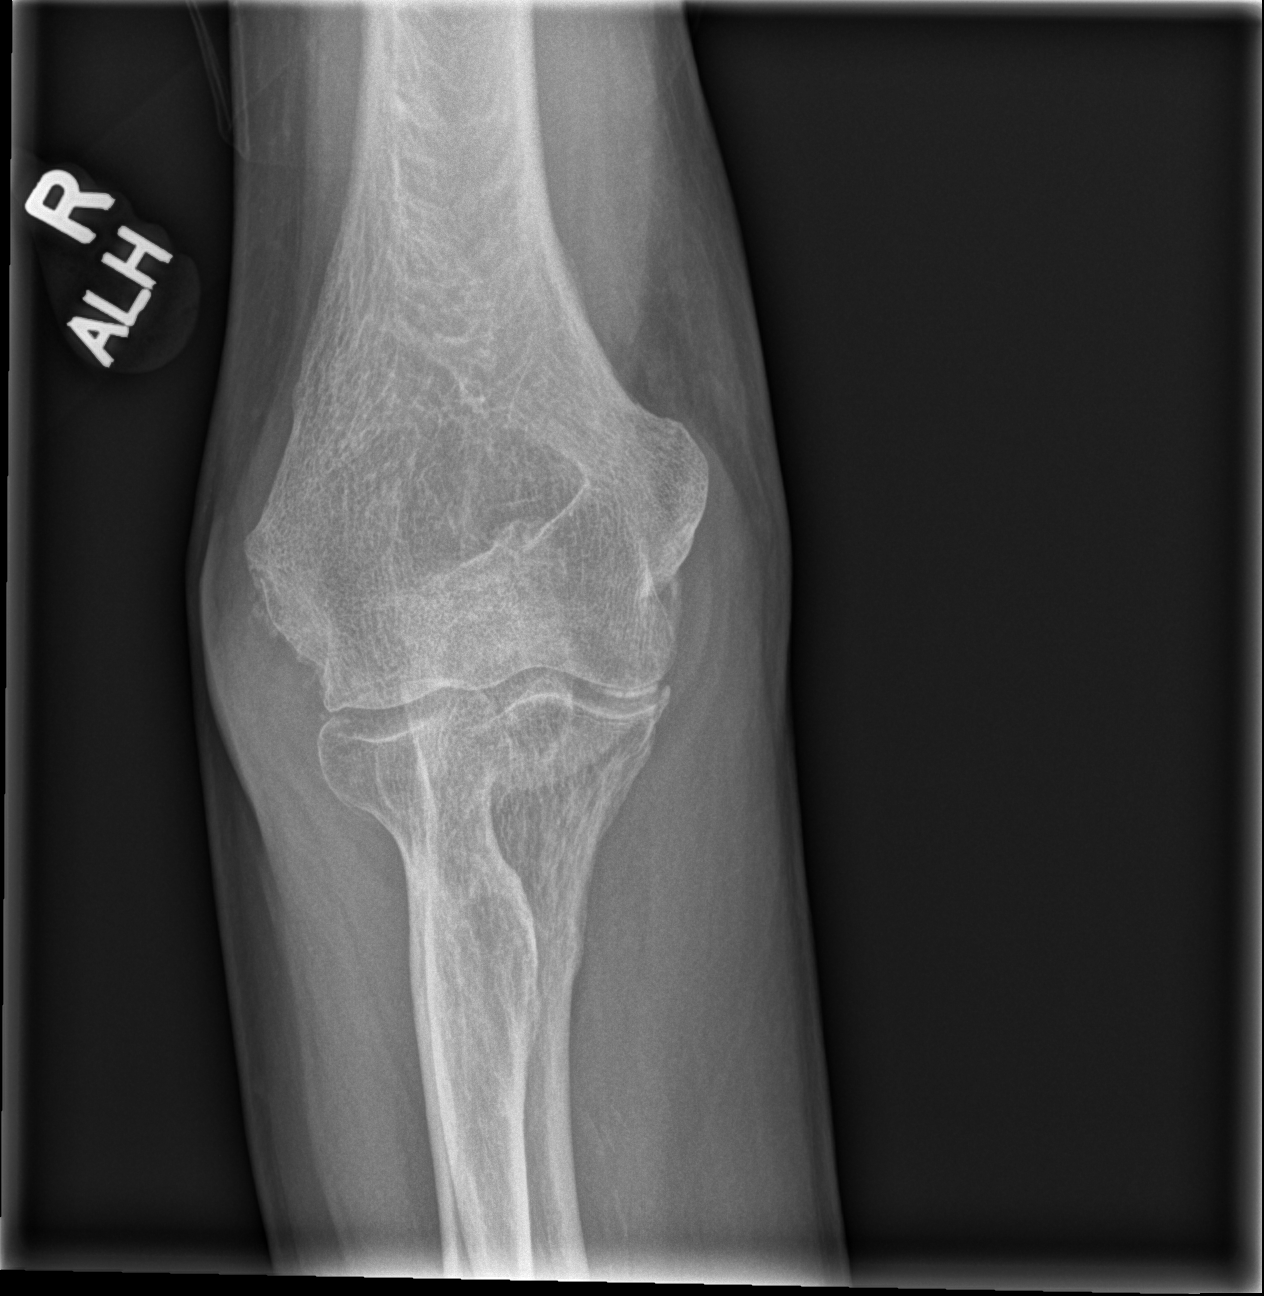

[2 of 2 positions shown; findings below may reference images not displayed]

FINDINGS: No dislocation. Positive for elbow effusion. No definitive fracture
seen. Calcific density adjacent to the radial head, possibly a small
loose body.
IMPRESSION: No definitive displaced fracture seen however elbow effusion is
present and occult fracture is possible.

## 2024-04-05 IMAGING — CR DG WRIST 2V*R*
3 series · 3 of 3 positions shown · non-contrast
Comparison: None Available.

CLINICAL DATA: Injury, pain

EXAM:
RIGHT WRIST - 2 VIEW

[x wrist pa right]
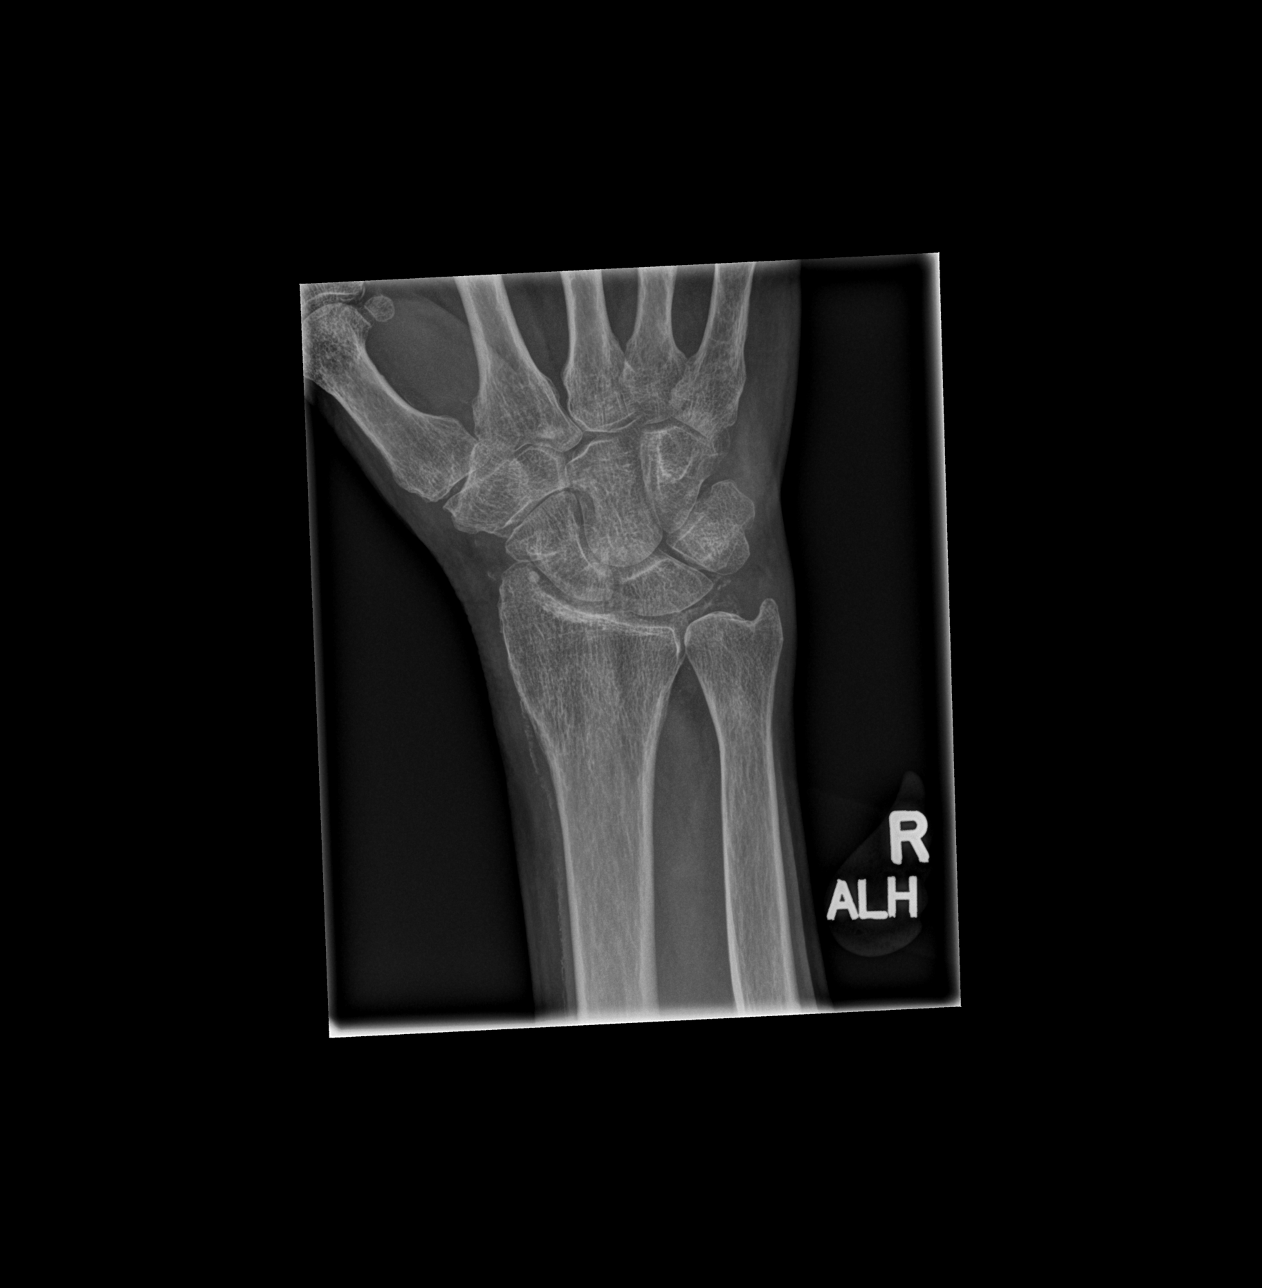

[x wrist lat right (1 of 2)]
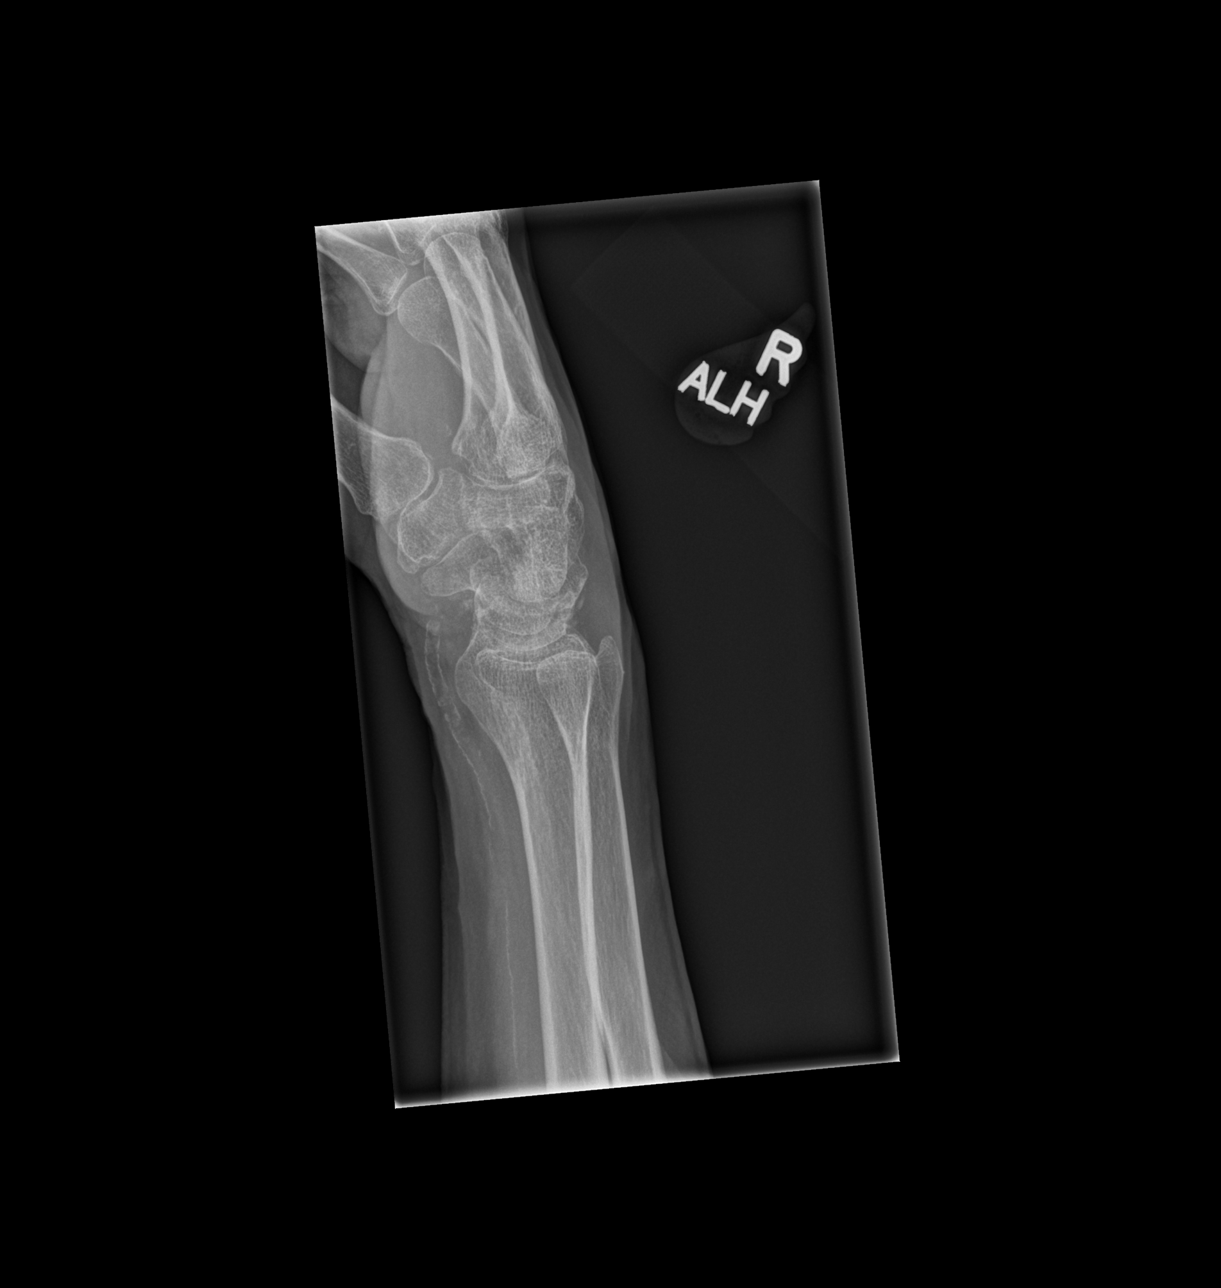

[x wrist lat right (2 of 2)]
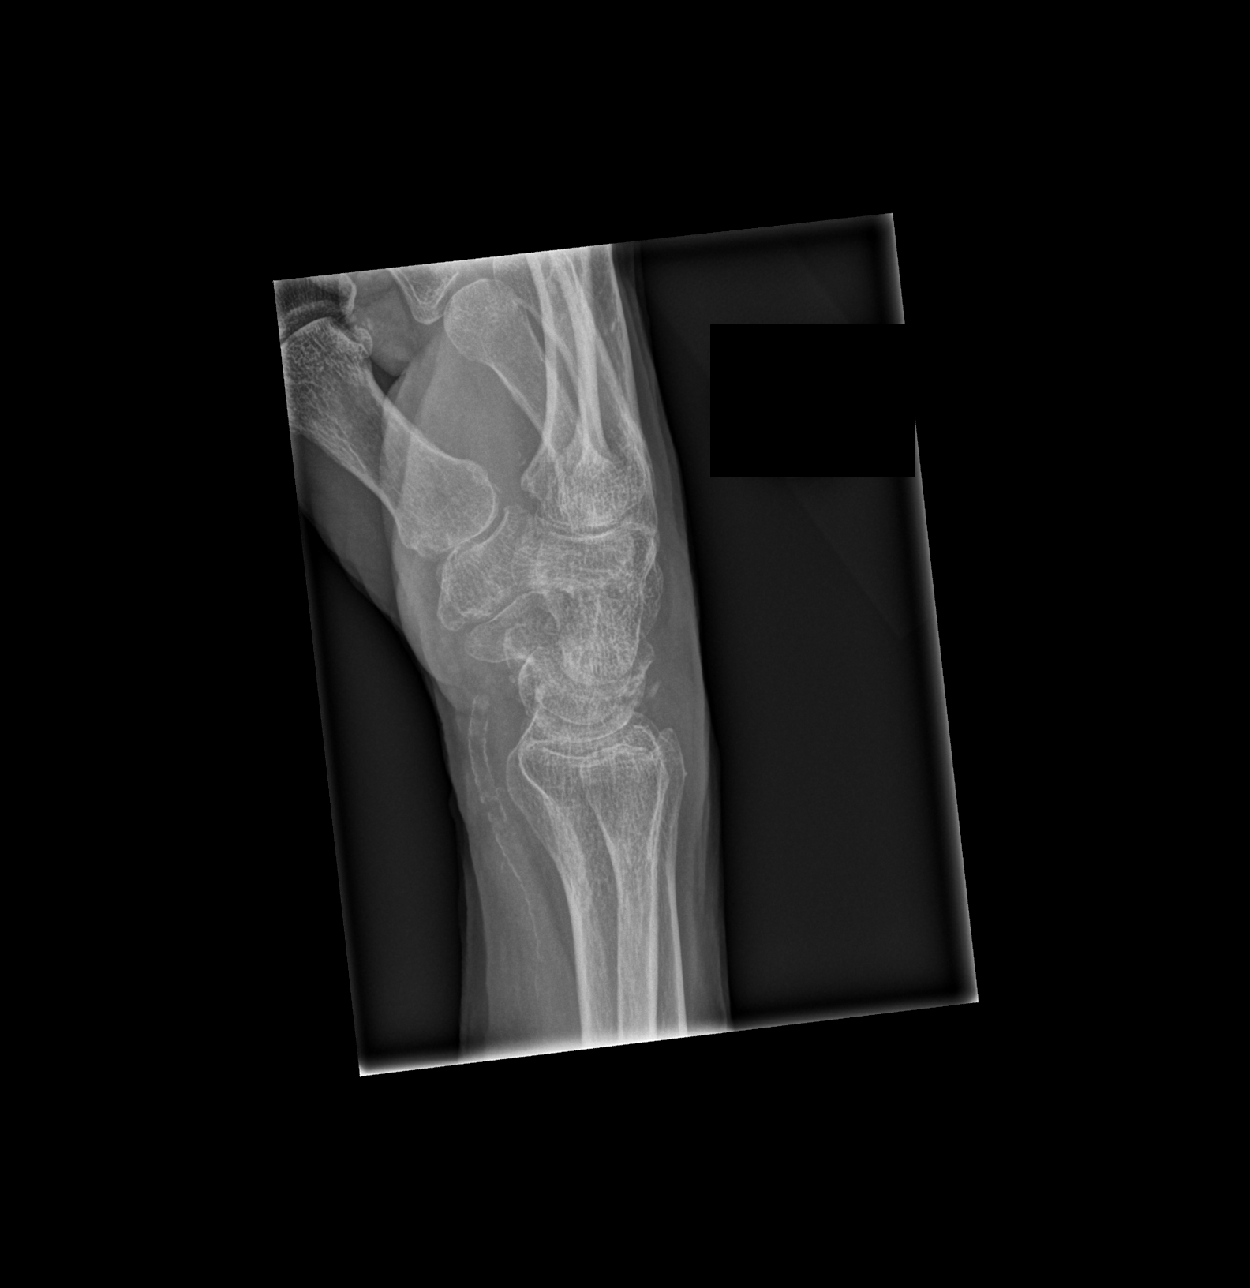

[3 of 3 positions shown; findings below may reference images not displayed]

FINDINGS: Vascular calcifications. No malalignment. Small osseous density
along the dorsal aspect of the wrist could relate to triquetral
fracture. Chondrocalcinosis and degenerative changes at the
radiocarpal interval and STT interval.
IMPRESSION: 1. Findings raising possibility of triquetral fracture.
2. Arthritis and chondrocalcinosis.

## 2024-08-13 ENCOUNTER — Encounter: Payer: Self-pay | Admitting: Internal Medicine
